# Patient Record
Sex: Female | Born: 1935 | Race: White | Hispanic: No | Marital: Married | Smoking: Never smoker
Health system: Southern US, Community
[De-identification: ages and names within clinical notes are randomized; demographics above are authoritative.]

## PROBLEM LIST (undated history)

## (undated) DIAGNOSIS — Z9842 Cataract extraction status, left eye: Secondary | ICD-10-CM

## (undated) DIAGNOSIS — G3184 Mild cognitive impairment, so stated: Secondary | ICD-10-CM

## (undated) DIAGNOSIS — G309 Alzheimer's disease, unspecified: Secondary | ICD-10-CM

## (undated) DIAGNOSIS — F0280 Dementia in other diseases classified elsewhere without behavioral disturbance: Secondary | ICD-10-CM

## (undated) DIAGNOSIS — M810 Age-related osteoporosis without current pathological fracture: Secondary | ICD-10-CM

## (undated) DIAGNOSIS — E559 Vitamin D deficiency, unspecified: Secondary | ICD-10-CM

## (undated) DIAGNOSIS — Z9841 Cataract extraction status, right eye: Secondary | ICD-10-CM

## (undated) DIAGNOSIS — E538 Deficiency of other specified B group vitamins: Secondary | ICD-10-CM

## (undated) DIAGNOSIS — E785 Hyperlipidemia, unspecified: Secondary | ICD-10-CM

## (undated) HISTORY — DX: Deficiency of other specified B group vitamins: E53.8

## (undated) HISTORY — DX: Hyperlipidemia, unspecified: E78.5

## (undated) HISTORY — DX: Age-related osteoporosis without current pathological fracture: M81.0

## (undated) HISTORY — PX: DILATION AND CURETTAGE OF UTERUS: SHX78

## (undated) HISTORY — DX: Cataract extraction status, left eye: Z98.42

## (undated) HISTORY — DX: Mild cognitive impairment of uncertain or unknown etiology: G31.84

## (undated) HISTORY — DX: Cataract extraction status, left eye: Z98.41

## (undated) HISTORY — PX: CATARACT EXTRACTION: SUR2

## (undated) HISTORY — PX: TONSILLECTOMY AND ADENOIDECTOMY: SHX28

---

## 2001-11-02 ENCOUNTER — Encounter: Payer: Self-pay | Admitting: Orthopedic Surgery

## 2001-11-02 ENCOUNTER — Ambulatory Visit (HOSPITAL_COMMUNITY): Admission: RE | Admit: 2001-11-02 | Discharge: 2001-11-02 | Payer: Self-pay | Admitting: Orthopedic Surgery

## 2003-03-23 ENCOUNTER — Encounter: Payer: Self-pay | Admitting: Emergency Medicine

## 2003-03-23 ENCOUNTER — Emergency Department (HOSPITAL_COMMUNITY): Admission: EM | Admit: 2003-03-23 | Discharge: 2003-03-23 | Payer: Self-pay | Admitting: Emergency Medicine

## 2003-05-06 ENCOUNTER — Encounter: Admission: RE | Admit: 2003-05-06 | Discharge: 2003-05-06 | Payer: Self-pay | Admitting: Family Medicine

## 2003-05-06 ENCOUNTER — Encounter: Payer: Self-pay | Admitting: Family Medicine

## 2003-05-20 ENCOUNTER — Ambulatory Visit (HOSPITAL_COMMUNITY): Admission: RE | Admit: 2003-05-20 | Discharge: 2003-05-20 | Payer: Self-pay | Admitting: Internal Medicine

## 2003-05-20 ENCOUNTER — Encounter (INDEPENDENT_AMBULATORY_CARE_PROVIDER_SITE_OTHER): Payer: Self-pay | Admitting: *Deleted

## 2005-03-02 ENCOUNTER — Encounter: Admission: RE | Admit: 2005-03-02 | Discharge: 2005-03-02 | Payer: Self-pay | Admitting: Family Medicine

## 2005-03-10 ENCOUNTER — Ambulatory Visit: Payer: Self-pay | Admitting: Pulmonary Disease

## 2005-03-14 ENCOUNTER — Ambulatory Visit: Admission: RE | Admit: 2005-03-14 | Discharge: 2005-03-14 | Payer: Self-pay | Admitting: Pulmonary Disease

## 2005-03-15 ENCOUNTER — Encounter (INDEPENDENT_AMBULATORY_CARE_PROVIDER_SITE_OTHER): Payer: Self-pay | Admitting: *Deleted

## 2005-03-15 ENCOUNTER — Ambulatory Visit: Payer: Self-pay | Admitting: Critical Care Medicine

## 2005-03-15 ENCOUNTER — Ambulatory Visit (HOSPITAL_COMMUNITY): Admission: RE | Admit: 2005-03-15 | Discharge: 2005-03-15 | Payer: Self-pay | Admitting: Critical Care Medicine

## 2005-03-22 ENCOUNTER — Ambulatory Visit: Payer: Self-pay | Admitting: Pulmonary Disease

## 2005-04-05 ENCOUNTER — Ambulatory Visit: Payer: Self-pay | Admitting: Pulmonary Disease

## 2005-06-08 ENCOUNTER — Ambulatory Visit: Payer: Self-pay | Admitting: Pulmonary Disease

## 2005-08-10 ENCOUNTER — Ambulatory Visit: Payer: Self-pay | Admitting: Pulmonary Disease

## 2005-09-19 ENCOUNTER — Ambulatory Visit: Payer: Self-pay | Admitting: Pulmonary Disease

## 2006-04-05 ENCOUNTER — Ambulatory Visit: Payer: Self-pay | Admitting: Critical Care Medicine

## 2009-02-15 ENCOUNTER — Emergency Department (HOSPITAL_COMMUNITY): Admission: EM | Admit: 2009-02-15 | Discharge: 2009-02-15 | Payer: Self-pay | Admitting: Emergency Medicine

## 2010-10-31 LAB — URINE MICROSCOPIC-ADD ON

## 2010-10-31 LAB — CBC
HCT: 43.8 % (ref 36.0–46.0)
Hemoglobin: 14.6 g/dL (ref 12.0–15.0)
MCHC: 33.4 g/dL (ref 30.0–36.0)
MCV: 95.2 fL (ref 78.0–100.0)
Platelets: 185 10*3/uL (ref 150–400)
RBC: 4.6 MIL/uL (ref 3.87–5.11)
RDW: 14.4 % (ref 11.5–15.5)
WBC: 6.5 10*3/uL (ref 4.0–10.5)

## 2010-10-31 LAB — URINALYSIS, ROUTINE W REFLEX MICROSCOPIC
Glucose, UA: 100 mg/dL — AB
Ketones, ur: 40 mg/dL — AB
Nitrite: POSITIVE — AB
Protein, ur: >300 mg/dL — AB
Specific Gravity, Urine: 1.025 (ref 1.005–1.030)
Urobilinogen, UA: 1 mg/dL (ref 0.0–1.0)
pH: 6.5 (ref 5.0–8.0)

## 2010-10-31 LAB — BASIC METABOLIC PANEL
BUN: 7 mg/dL (ref 6–23)
CO2: 28 mEq/L (ref 19–32)
Calcium: 9.1 mg/dL (ref 8.4–10.5)
Chloride: 107 mEq/L (ref 96–112)
Creatinine, Ser: 0.76 mg/dL (ref 0.4–1.2)
GFR calc Af Amer: >60 mL/min (ref >60)
GFR calc non Af Amer: >60 mL/min (ref >60)
Glucose, Bld: 97 mg/dL (ref 70–99)
Potassium: 3.5 mEq/L (ref 3.5–5.1)
Sodium: 142 mEq/L (ref 135–145)

## 2010-10-31 LAB — DIFFERENTIAL
Basophils Absolute: 0.1 10*3/uL (ref 0.0–0.1)
Basophils Relative: 1 % (ref 0–1)
Eosinophils Absolute: 0.1 10*3/uL (ref 0.0–0.7)
Eosinophils Relative: 2 % (ref 0–5)
Lymphocytes Relative: 26 % (ref 12–46)
Lymphs Abs: 1.7 10*3/uL (ref 0.7–4.0)
Monocytes Absolute: 0.7 10*3/uL (ref 0.1–1.0)
Monocytes Relative: 10 % (ref 3–12)
Neutro Abs: 3.9 10*3/uL (ref 1.7–7.7)
Neutrophils Relative %: 61 % (ref 43–77)

## 2010-10-31 LAB — URINE CULTURE: Colony Count: >=100000

## 2010-12-10 NOTE — Op Note (Signed)
NAME:  Savannah Garrison, WOODRUM NO.:  [id number]   MEDICAL RECORD NO.:  [id number]          PATIENT TYPE:  AMB   LOCATION:  ENDO                         FACILITY:  [facility]   PHYSICIAN:  Shan Levans, M.D. LHCDATE OF BIRTH:  August 13, 1929   DATE OF PROCEDURE:  03/15/2005  DATE OF DISCHARGE:                                 OPERATIVE REPORT   BRONCHOSCOPY OPERATIVE NOTE:   INDICATION:  Bilateral pulmonary infiltrates, evaluate for organized  pneumonia.   OPERATOR:  Shan Levans, M.D.   ANESTHESIA:  Local 1% Xylocaine.   PREOP MEDICATION:  1.  Demerol 30 mg.  2.  Versed 3 mg IV push.   PROCEDURE:  The Olympus video bronchoscope was introduced via the right  nares, the upper airways were visualized, were unremarkable.  The entire  tracheobronchial tree was visualized and revealed mild tracheobronchitis.  Specifically, there were no endobronchial lesions seen.  Attention was then  paid to the left lower lobe lateral segment.  This bronchoscope was wedged  into position.  A 120 mL volume was instilled.  A very poor return of  approximately 40 mL volume was obtained.  Transbronchial biopsies x6 were  obtained.  The specimens were quite small.  There were, however, no  complications and no significant bleeding seen.   COMPLICATIONS:  None at time of dictation, chest x-ray pending.   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  Evaluate for chronic unresolved pulmonary  infiltrates.   RECOMMENDATIONS:  Followup microbiology and pathology.      Shan Levans, M.D. [facility]  Electronically Signed     PW/MEDQ  D:  03/15/2005  T:  03/15/2005  Job:  [id number]   cc:   Chales Salmon. Abigail Miyamoto, M.D.  [address]  [place]  [place] [address]  Fax: [phone]   Barrington Ellison, Dr.   Patient's Chart

## 2010-12-10 NOTE — Op Note (Signed)
NAME:  Savannah Garrison, Savannah Garrison                        ACCOUNT NO.:  [id number]   MEDICAL RECORD NO.:  [id number]                   PATIENT TYPE:  AMB   LOCATION:  ENDO                                 FACILITY:  [facility]   PHYSICIAN:  Clinton D. Maple Hudson, M.D.              DATE OF BIRTH:  20-Aug-1929   DATE OF PROCEDURE:  05/20/2003  DATE OF DISCHARGE:                                 OPERATIVE REPORT   PROCEDURE PERFORMED:  Bronchoscopy.   INDICATIONS FOR PROCEDURE:  The patient is a 77 year old nonsmoking white  female with a shifting right middle lung zone infiltrate at least since  August, associated with dry cough, no response  to initial antibiotic trial,  8 pound weight loss, no  fevers or chills.   MEDICATIONS:  Preoperative evaluation  noted medications of hormones and  calcium.   ALLERGIES:  PENICILLIN WITH ITCHING.   MEDICAL HISTORY:  No history of heart or diabetic problems and no TB  exposure.   PHYSICAL EXAMINATION:  Blood pressure 125/85, weight 127 pounds. Question of  left supraclavicular fullness, uncertain if there was underlying adenopathy  or just muscle. Right mid chest crackles. Heart  sounds were regular without  murmur or gallop.   LABORATORY DATA:  Chest x-ray showed right upper lobe, right middle lobe and  right lower lobe infiltrates, shifting across serial films with no central  lesion noted.   DESCRIPTION OF PROCEDURE:  After fully informed consent a bronchoscopy was  performed on an outpatient basis in the endoscopy suite. Premedication was  not needed. The upper airway was anesthetized topically with Cetacaine and  1% Xylocaine. A cumulative dose of 5 mg of intravenous Versed was given  during the procedure for additional cough control and sedation. Oxygen was  provided at 8 liters per minute by nasal prongs, holding saturation over  92%. Cardiac  rhythm was regular with frequent premature ventricular  contractions noted, unchanged during the procedure.   After fully informed consent was obtained the bronchoscope was introduced  via the right nostril to the level  of the vocal cords without difficulty.  The cords moved normally. The trachea and main carina were unremarkable.  Secretions were clear. There was minimal erythema in the bronchus  intermedius. No foreign material or endobronchial obstruction  was noted.   Under fluoroscopic guidance the right middle lobe was brushed, lavaged and  biopsied by standard transbronchial biopsy technique. There was mild self-  limited bleeding with no significant complications pending chest x-ray.   The patient tolerated the procedure well and is being held until stable.  Then we will return her home with her family to office follow up.    IMPRESSION:  Nonspecific but atypical pneumonia  with shifting infiltrates  over a period of several months associated with question of supraclavicular  adenopathy.  Clinton D. Maple Hudson, M.D.    CDY/MEDQ  D:  05/20/2003  T:  05/20/2003  Job:  [id number]   cc:   Chales Salmon. Abigail Miyamoto, M.D.  [address]  [place]  [place] [address]  Fax: [phone]

## 2010-12-10 NOTE — Assessment & Plan Note (Signed)
[facility]                               PULMONARY OFFICE NOTE   NAME:Savannah Garrison, Savannah Garrison                     MRN:          [id number]  DATE:04/05/2006                            DOB:          04/03/1930    HISTORY OF PRESENT ILLNESS:  The patient is a 77 year old white female  patient of Dr. Craige Cotta who had a history of bronchiolitis obliterans with  organized pneumonia.  Patient presents for a 40-month followup.  Patient has  now been off of prednisone for greater than 6 months and reports that she  has had no recurrence of symptoms of dyspnea, cough, fatigue, or chest pain.  Patient reports she has been doing exceptionally well.  Patient does note,  intermittently, she has a very mild dry cough, intermittently, without any  purulent sputum, fever, or chest pain.   PAST MEDICAL HISTORY:  Reviewed.   CURRENT MEDICATIONS:  Reviewed.   PHYSICAL EXAMINATION:  GENERAL:  Patient is a pleasant female in no acute  distress.  VITAL SIGNS:  She is afebrile with stable vital signs.  Her O2 saturation is  97% on room air.  HEENT:  Unremarkable.  NECK:  Supple without adenopathy.  LUNGS:  Lung sounds are clear without any wheezes or crackles.  CARDIAC:  Regular rate and rhythm.  ABDOMEN:  Soft, benign.  EXTREMITIES:  Warm without any edema.   IMPRESSION AND PLAN:  Bronchiolitis obliterans with organized pneumonia.  Patient has now been off of prednisone for greater than 6 months without any  recurrence of symptoms.  Patient is advised if she develops any recurrence  of symptoms, she may follow back up with [place].                                   Rubye Oaks, NP                                Coralyn Helling, MD   TP/MedQ  DD:  04/06/2006  DT:  04/07/2006  Job #:  [id number]

## 2010-12-10 NOTE — Op Note (Signed)
[facility]  Patient:    Savannah Garrison, Savannah Garrison Visit Number: [id number] MRN: [id number]          Service Type: DSU Location: DAY Attending Physician:  Marlowe Kays Page Dictated by:   Illene Labrador. Aplington, M.D. Proc. Date: 11/02/01 Admit Date:  11/02/2001                             Operative Report  PREOPERATIVE DIAGNOSES: 1. Torn lateral meniscus. 2. Osteoarthritis, right knee.  POSTOPERATIVE DIAGNOSES: 1. Torn medial and lateral menisci. 2. Osteoarthritis, right knee.  OPERATION: 1. Right knee arthroscopy with one partial medial and lateral meniscectomy. 2. Shaving of medial and lateral femoral condyle.  SURGEON:  Illene Labrador. Aplington, M.D.  ASSISTANT:  Nurse.  ANESTHESIA:  General.  PATHOLOGY AND JUSTIFICATION OF PROCEDURE:  Pain and swelling in right knee with tenderness medially and an MRI showing osteoarthritis, mainly laterally with a torn lateral meniscus.  She had additional findings at surgery as discussed below.  DESCRIPTION OF PROCEDURE:  Satisfactory spinal anesthesia, pneumatic tourniquet, thigh stabilizer.  Right knee was prepped with DuraPrep and draped in a sterile field.  Superior and medial saline inflow.  First through an anteromedial portal, the lateral compartment of knee joint was evaluated.  She had extensive tearing, more than was indicated on the MRI, mainly in the middle third but also slightly anteriorly and also in the intercondylar area of the lateral meniscus.  The meniscus was trimmed back to a stable rim with a variety of baskets and then shaved down until smooth and stable on probing. The lateral femoral condyle also required some shaving because of grade 2-3/4 chondromalacia.  She also about the same extent of chondromalacia of the lateral tibial plateau which I did not shave.  Her ECL was intact.  Looking up in the lateral gutter and suprapatellar area, she had some minimal wear of the patella which I  gently debrided down, but basically this was a nonarthroscopic problem.  I then reversed portals.  She had a flap tear of the anterior third of the medial meniscus which was pictured and resected and smoothed down with the 3.5 shaver.  She also had a little fraying of the posterior curve of the medial meniscus which I shaved down as well.  The entire weightbearing surface of the medial femoral condyle had grade 2-3/4 chondromalacia, and I shaved down this as well as some rasping gently.  Final pictures were taken.  The knee joint was irrigated until clear and all fluid possible removed.  The two anterior portals were closed with 4-0 nylon.  Then 20 cc of 0.5% Marcaine with adrenalin, 4 mg of morphine were then instilled through the inflow apparatus which was removed and this portal closed with 4-0 nylon as well.  Betadine, Adaptic dry sterile dressing were applied.  Tourniquet was released.  She tolerated the procedure well and at the time of this dictation, was on her way to the recovery room in satisfactory condition with no known complications. Dictated by:   Illene Labrador. Aplington, M.D. Attending Physician:  Joaquin Courts DD:  11/02/01 TD:  11/02/01 Job: [id number]

## 2011-09-19 ENCOUNTER — Other Ambulatory Visit: Payer: Self-pay | Admitting: Diagnostic Neuroimaging

## 2011-09-19 DIAGNOSIS — G3184 Mild cognitive impairment, so stated: Secondary | ICD-10-CM

## 2011-09-23 ENCOUNTER — Ambulatory Visit
Admission: RE | Admit: 2011-09-23 | Discharge: 2011-09-23 | Disposition: A | Discharge status: Home / Self Care | Payer: Medicare HMO | Source: Ambulatory Visit | Attending: Diagnostic Neuroimaging | Admitting: Diagnostic Neuroimaging

## 2011-09-23 DIAGNOSIS — G3184 Mild cognitive impairment, so stated: Secondary | ICD-10-CM

## 2012-07-14 ENCOUNTER — Encounter (HOSPITAL_COMMUNITY): Payer: Self-pay | Admitting: Emergency Medicine

## 2012-07-14 ENCOUNTER — Emergency Department (HOSPITAL_COMMUNITY): Payer: Medicare HMO

## 2012-07-14 ENCOUNTER — Emergency Department (HOSPITAL_COMMUNITY)
Admission: EM | Admit: 2012-07-14 | Discharge: 2012-07-14 | Disposition: A | Discharge status: Home / Self Care | Payer: Medicare HMO | Attending: Emergency Medicine | Admitting: Emergency Medicine

## 2012-07-14 DIAGNOSIS — Y9301 Activity, walking, marching and hiking: Secondary | ICD-10-CM | POA: Insufficient documentation

## 2012-07-14 DIAGNOSIS — S0003XA Contusion of scalp, initial encounter: Secondary | ICD-10-CM | POA: Insufficient documentation

## 2012-07-14 DIAGNOSIS — Z7982 Long term (current) use of aspirin: Secondary | ICD-10-CM | POA: Insufficient documentation

## 2012-07-14 DIAGNOSIS — Y9289 Other specified places as the place of occurrence of the external cause: Secondary | ICD-10-CM | POA: Insufficient documentation

## 2012-07-14 DIAGNOSIS — W1809XA Striking against other object with subsequent fall, initial encounter: Secondary | ICD-10-CM | POA: Insufficient documentation

## 2012-07-14 DIAGNOSIS — S0083XA Contusion of other part of head, initial encounter: Secondary | ICD-10-CM

## 2012-07-14 NOTE — ED Notes (Signed)
Pt reports she fell doing yard work onto cement and hit her head. Pt has moderately sized hematoma above her right eye. Pt denies double, blurred, or any changes to vision. Pt denies LOC.

## 2012-07-14 NOTE — ED Provider Notes (Signed)
History     CSN: [id number]  Arrival date & time 07/14/12  1433   First MD Initiated Contact with Patient 07/14/12 1518      Chief Complaint  Patient presents with  . Fall    Hematoma aboce right eye    (Consider location/radiation/quality/duration/timing/severity/associated sxs/prior treatment) HPI Comments: Patient was cleaning up the yard and was breaking sticks when she fell and hit her head on the curb.  She was not knocked out but does have a large hematoma to the right forehead and eyebrow area.  She denies severe headache, neck pain, visual complaints.  Takes one 81 mg asa daily.  Patient is a 78 y.o. female presenting with fall. The history is provided by the patient.  Fall The accident occurred less than 1 hour ago. The fall occurred while walking. She fell from a height of 1 to 2 ft. She landed on concrete. The volume of blood lost was minimal. The point of impact was the head. The pain is present in the head. The pain is moderate. She was ambulatory at the scene. There was no entrapment after the fall. Pertinent negatives include no visual change.    History reviewed. No pertinent past medical history.  History reviewed. No pertinent past surgical history.  No family history on file.  History  Substance Use Topics  . Smoking status: Not on file  . Smokeless tobacco: Never Used  . Alcohol Use: Yes     Comment: Burbon every night.     OB History    Grav Para Term Preterm Abortions TAB SAB Ect Mult Living                  Review of Systems  All other systems reviewed and are negative.    Allergies  Review of patient's allergies indicates not on file.  Home Medications  No current outpatient prescriptions on file.  BP 161/74  Pulse 74  Temp 98 F (36.7 C) (Oral)  Resp 18  SpO2 100%  Physical Exam  Nursing note and vitals reviewed. Constitutional: She is oriented to person, place, and time. She appears well-developed and well-nourished. No  distress.  HENT:  Head: Normocephalic.       The right eyebrow and forehead have a large hematoma with a small abrasion that does not require stitches.  Neck: Normal range of motion. Neck supple.       No cervical spine ttp or stepoffs.  Full rom without pain.  Cardiovascular: Normal rate and regular rhythm.   Pulmonary/Chest: Effort normal and breath sounds normal.  Abdominal: Soft. Bowel sounds are normal.  Musculoskeletal: Normal range of motion. She exhibits no edema.  Neurological: She is alert and oriented to person, place, and time. No cranial nerve deficit. She exhibits normal muscle tone. Coordination normal.  Skin: Skin is warm and dry. She is not diaphoretic.    ED Course  Procedures (including critical care time)  Labs Reviewed - No data to display No results found.   No diagnosis found.    MDM  The ct shows no intracranial injury but she does have a large hematoma.  The abrasion was cleaned and bacitracin and bandaid applied.  She will be discharged to home.  To return prn.        Geoffery Lyons, MD 07/14/12 [phone]

## 2012-10-11 ENCOUNTER — Other Ambulatory Visit: Payer: Self-pay | Admitting: Diagnostic Neuroimaging

## 2012-12-25 ENCOUNTER — Encounter: Payer: Self-pay | Admitting: Diagnostic Neuroimaging

## 2012-12-25 ENCOUNTER — Ambulatory Visit (INDEPENDENT_AMBULATORY_CARE_PROVIDER_SITE_OTHER): Payer: Medicare HMO | Admitting: Diagnostic Neuroimaging

## 2012-12-25 VITALS — BP 185/99 | HR 75 | Temp 97.0°F | Ht 63.5 in | Wt 139.0 lb

## 2012-12-25 DIAGNOSIS — R413 Other amnesia: Secondary | ICD-10-CM

## 2012-12-25 MED ORDER — DONEPEZIL HCL 10 MG PO TABS: 10.0000 mg | ORAL_TABLET | Freq: Every day | ORAL | Status: DC

## 2012-12-25 NOTE — Patient Instructions (Addendum)
Increase donepezil 10 mg daily.   Consider Namenda XRT 20 mg daily depending on co-pay.

## 2012-12-25 NOTE — Progress Notes (Signed)
[facility]  PATIENT: Savannah Garrison DOB: 09/06/1929  REFERRING CLINICIAN:  HISTORY FROM: patient, husband, daughter, son-in-law REASON FOR VISIT: follow up   HISTORICAL  CHIEF COMPLAINT:  Chief Complaint  Patient presents with  . Follow-up    memory    HISTORY OF PRESENT ILLNESS:   UPDATE 12/25/12: since last visit, patient has had progression of memory loss. This is noted by patient and her family. She is misplacing more objects in the home and kitchen. She is repeating herself more often. 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 patient is tolerating donepezil.  UPDATE 09/09/11: Memory loss has slightly progressed. Forgeting names of people. Repeats herself more. More problems in unfamiliar situations. Still drives without problems, although no one has been in the car with her when she is driving. No probs with cooking.  PRIOR HPI (02/18/10): 79 year old right-handed female with no significant past medical history presenting for evaluation of memory difficulty over the past few months. He is accompanied by her husband at this visit.  Patient reports some difficulty in conversation where in mid sentence she forgets what word to say next. This has been intermittent over the past few months. She thinks it's been less than 6 months. She is concerned because her father and paternal uncle both were diagnosed with Alzheimer's disease in their 18s. Patient has noticed the symptoms herself, but her husband and her daughter have noticed it more. Patient is not that bothered by her symptoms. She continues to drive, is able to shop, maintains household finances and also does cooking and other house chores.  REVIEW OF SYSTEMS: Full 14 system review of systems performed and notable only for ringing in ears bruising runny nose decreased energy dizziness memory loss  confusion.  ALLERGIES: No Known Allergies  HOME MEDICATIONS: Outpatient Prescriptions Prior to Visit  Medication Sig Dispense Refill  . aspirin EC 81 MG tablet Take 81 mg by mouth daily.      Marland Kitchen donepezil (ARICEPT) 5 MG tablet TAKE 1 TABLET BY MOUTH EVERY NIGHT  30 tablet  3  . Multiple Vitamin (MULTIVITAMIN WITH MINERALS) TABS Take 1 tablet by mouth daily.       No facility-administered medications prior to visit.    PAST MEDICAL HISTORY: Past Medical History  Diagnosis Date  . MCI (mild cognitive impairment)     PAST SURGICAL HISTORY: Past Surgical History  Procedure Laterality Date  . Dilation and curettage of uterus      FAMILY HISTORY: Family History  Problem Relation Age of Onset  . Lung cancer Mother   . Alzheimer's disease Father     SOCIAL HISTORY:  History   Social History  . Marital Status: Married    Spouse Name: Aldrad    Number of Children: 2  . Years of Education: College   Occupational History  . Retired    Social History Main Topics  . Smoking status: Never Smoker   . Smokeless tobacco: Never Used  . Alcohol Use: Yes     Comment: Burbon every night.   . Drug Use: No  . Sexually Active: Not on file   Other Topics Concern  . Not on file   Social History Narrative   Pt lives at home with spouse.    Has been married for 53+ years.   Caffeine Use: 1-2 glasses daily (green tea)     PHYSICAL  EXAM  Filed Vitals:   12/25/12 1545  BP: 201/102  Pulse: 74  Temp: 97 F (36.1 C)  TempSrc: Oral  Height: 5' 3.5" (1.613 m)  Weight: 139 lb (63.05 kg)    Not recorded    Body mass index is 24.23 kg/(m^2).  GENERAL EXAM: Patient is in no distress  CARDIOVASCULAR: Regular rate and rhythm, no murmurs, no carotid bruits  NEUROLOGIC: MENTAL STATUS: awake, alert, language fluent, comprehension intact, naming intact; BORDERLINE PALMOMENTAL. MMSE 22/30 (MISSES YEAR, DAY, DATE, COUNTY; 0/3 RECALL, 2/3 DIRECTIONS). AFT 6. CRANIAL NERVE:  pupils equal and reactive to light, visual fields full to confrontation, extraocular muscles intact, no nystagmus, facial sensation and strength symmetric, uvula midline, shoulder shrug symmetric, tongue midline. MOTOR: normal bulk and tone, full strength in the BUE, BLE SENSORY: normal and symmetric to light touch, pinprick, temperature, vibration COORDINATION: finger-nose-finger, fine finger movements normal REFLEXES: deep tendon reflexes present and symmetric GAIT/STATION: SLOW ANTALGIC GAIT. RIGHT KNEE DEVIATED MEDIALLY. DECR RIGHT ARM SWING. STOOP POSTURE. SLOW MOVING.   DIAGNOSTIC DATA (LABS, IMAGING, TESTING) - I reviewed patient records, labs, notes, testing and imaging myself where available.  Lab Results  Component Value Date   WBC 6.5 02/15/2009   HGB 14.6 02/15/2009   HCT 43.8 02/15/2009   MCV 95.2 02/15/2009   PLT 185 02/15/2009      Component Value Date/Time   NA 142 02/15/2009 1740   K 3.5 02/15/2009 1740   CL 107 02/15/2009 1740   CO2 28 02/15/2009 1740   GLUCOSE 97 02/15/2009 1740   BUN 7 02/15/2009 1740   CREATININE 0.76 02/15/2009 1740   CALCIUM 9.1 02/15/2009 1740   GFRNONAA >60 02/15/2009 1740   GFRAA  Value: >60        The eGFR has been calculated using the MDRD equation. This calculation has not been validated in all clinical situations. eGFR's persistently <60 mL/min signify possible Chronic Kidney Disease. 02/15/2009 1740   No results found for this basename: CHOL, HDL, LDLCALC, LDLDIRECT, TRIG, CHOLHDL   No results found for this basename: HGBA1C   No results found for this basename: VITAMINB12   No results found for this basename: TSH    10/07/10  B12 - 138 (L) TSH 0.64 RPR NR  09/23/11 MRI brain - mild chronic microvascular ischemia and supratentorial cortical atrophy   ASSESSMENT AND PLAN  79 y.o. year old female  has a past medical history of MCI (mild cognitive impairment). here with progressive short-term memory loss. Prior B12 level in 2012 was very  low, was on oral replacement in the past, however patient is not on replacement currently.    Ddx: MCI, mild dementia, Vit B12 deficiency   PLAN: 1. F/u with PCP re: B12 deficiency and hypertension (201/102 --> 185/99 today, but asymptomatic) 2. I will increase donepezil 10 mg daily. May consider adding Namenda or enrollment into research trial.   Suanne Marker, MD 12/25/2012, 4:41 PM Certified in Neurology, Neurophysiology and Neuroimaging  [facility] [address] [place], [place] [address] [phone]

## 2012-12-26 ENCOUNTER — Other Ambulatory Visit: Payer: Self-pay | Admitting: Diagnostic Neuroimaging

## 2012-12-26 NOTE — Telephone Encounter (Signed)
Dr Marjory Lies said they would consider Namenda depending on patients co-pay.  Patient was going to call insurance and discuss coverage/co-pay amount before deciding wether or not to start meds.  I called the patient.  She would prefer that we speak to her husband.  Will call back in the morning.

## 2012-12-27 MED ORDER — MEMANTINE HCL ER 7 & 14 & 21 &28 MG PO CP24: 7.0000 mg | ORAL_CAPSULE | ORAL | Status: DC

## 2012-12-27 NOTE — Telephone Encounter (Signed)
I called back.  Spoke with spouse.  He said they did call ins and they are okay with the Co-pay for Namenda XR.  They would like to start on the Titration pack.  Spouse is aware the pharmacy may not have the starter kit in stock, and may have to order it.  (We do not currently have samples, as they are not available at this time).

## 2013-01-19 ENCOUNTER — Other Ambulatory Visit: Payer: Self-pay | Admitting: Diagnostic Neuroimaging

## 2013-05-03 ENCOUNTER — Ambulatory Visit (INDEPENDENT_AMBULATORY_CARE_PROVIDER_SITE_OTHER): Payer: Medicare HMO | Admitting: Nurse Practitioner

## 2013-05-03 ENCOUNTER — Encounter: Payer: Self-pay | Admitting: Diagnostic Neuroimaging

## 2013-05-03 ENCOUNTER — Encounter (INDEPENDENT_AMBULATORY_CARE_PROVIDER_SITE_OTHER): Payer: Self-pay

## 2013-05-03 VITALS — BP 143/83 | HR 73 | Temp 98.0°F | Ht 63.5 in | Wt 132.0 lb

## 2013-05-03 DIAGNOSIS — F03A0 Unspecified dementia, mild, without behavioral disturbance, psychotic disturbance, mood disturbance, and anxiety: Secondary | ICD-10-CM

## 2013-05-03 DIAGNOSIS — F0390 Unspecified dementia without behavioral disturbance: Secondary | ICD-10-CM

## 2013-05-03 DIAGNOSIS — R413 Other amnesia: Secondary | ICD-10-CM

## 2013-05-03 MED ORDER — DONEPEZIL HCL 10 MG PO TABS: 10.0000 mg | ORAL_TABLET | Freq: Every day | ORAL | Status: DC

## 2013-05-03 NOTE — Patient Instructions (Signed)
Refills for Donepizil (Aricept) 10 mg sent to [facility] on [address].  It is a 90 day supply, take 1 daily at bedtime.  Follow up in 6 months.  http://www.senior-[facility]-[place].org  Search "[facility]."  [employer] informational resource:      http://www.aboutassistedliving.org   [facility]   Www.homeinstead.com  [facility]  [facility]  WirelessSleep.no  Having a home evaluation Having a care giver come to the home and conduct a thorough assessment of how you or your loved one functions in the home is the best way to determine if assisted living is appropriate. This type of assessment is referred to as a home evaluation, or a home assessment. This assessment doesn't take much time and will be done through [facility].

## 2013-05-03 NOTE — Progress Notes (Signed)
[facility]  PATIENT: Savannah Garrison DOB: 11/20/1929   REASON FOR VISIT: follow up HISTORY FROM: daughter, husband, patient  HISTORY OF PRESENT ILLNESS: UPDATE 05/03/13 (LL): since last visit, no significant change.  Patient did not tolerate Namenda due to stomach upset, confusion.  They have not followed up on B12 as requested. BP is much better in office today,  143/83.  Patient has no complaints.  UPDATE 12/25/12: since last visit, patient has had progression of memory loss. This is noted by patient and her family. She is misplacing more objects in the home and kitchen. She is repeating herself more often. 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 patient is tolerating donepezil.  UPDATE 09/09/11: Memory loss has slightly progressed. Forgeting names of people. Repeats herself more. More problems in unfamiliar situations. Still drives without problems, although no one has been in the car with her when she is driving. No probs with cooking.  PRIOR HPI (02/18/10): 78 year old right-handed female with no significant past medical history presenting for evaluation of memory difficulty over the past few months. He is accompanied by her husband at this visit.  Patient reports some difficulty in conversation where in mid sentence she forgets what word to say next. This has been intermittent over the past few months. She thinks it's been less than 6 months. She is concerned because her father and paternal uncle both were diagnosed with Alzheimer's disease in their 8s. Patient has noticed the symptoms herself, but her husband and her daughter have noticed it more. Patient is not that bothered by her symptoms. She continues to drive, is able to shop, maintains household finances and also does cooking and other house chores.  REVIEW OF SYSTEMS: Full 14 system review of systems performed  and notable only for ringing in ears bruising runny nose decreased energy dizziness memory loss confusion.   ALLERGIES: No Known Allergies  HOME MEDICATIONS: Outpatient Prescriptions Prior to Visit  Medication Sig Dispense Refill  . aspirin EC 81 MG tablet Take 81 mg by mouth daily.      . Multiple Vitamin (MULTIVITAMIN WITH MINERALS) TABS Take 1 tablet by mouth daily.      . Memantine HCl ER (NAMENDA XR TITRATION PACK) 7 & 14 & 21 &28 MG CP24 Take 7-28 mg by mouth as directed. Follow package instructions for titration dose  28 capsule  0  . donepezil (ARICEPT) 10 MG tablet Take 1 tablet (10 mg total) by mouth at bedtime.  30 tablet  12   No facility-administered medications prior to visit.    PAST MEDICAL HISTORY: Past Medical History  Diagnosis Date  . MCI (mild cognitive impairment)     PAST SURGICAL HISTORY: Past Surgical History  Procedure Laterality Date  . Dilation and curettage of uterus      FAMILY HISTORY: Family History  Problem Relation Age of Onset  . Lung cancer Mother   . Alzheimer's disease Father     SOCIAL HISTORY: History   Social History  . Marital Status: Married    Spouse Name: Aldrad    Number of Children: 2  . Years of Education: College   Occupational History  . Retired    Social History Main Topics  . Smoking status: Never Smoker   . Smokeless tobacco: Never Used  . Alcohol Use: Yes     Comment: Burbon every night.   Marland Kitchen  Drug Use: No  . Sexual Activity: Not on file   Other Topics Concern  . Not on file   Social History Narrative   Pt lives at home with spouse.    Has been married for 53+ years.   Caffeine Use: 1-2 glasses daily (green tea)     PHYSICAL EXAM  Filed Vitals:   05/03/13 0846  BP: 143/83  Pulse: 73  Temp: 98 F (36.7 C)  TempSrc: Oral  Height: 5' 3.5" (1.613 m)  Weight: 132 lb (59.875 kg)   Body mass index is 23.01 kg/(m^2).  GENERAL EXAM:  Patient is in no distress  CARDIOVASCULAR:  Regular rate  and rhythm, no murmurs, no carotid bruits  NEUROLOGIC:  MENTAL STATUS: awake, alert, language fluent, comprehension intact, naming intact; BORDERLINE PALMOMENTAL. MMSE 23/30 (MISSES YEAR, DAY, DATE, STATE, COUNTY; 0/3 RECALL, 3/3 DIRECTIONS). AFT 7. CLOCK DRAWING 4/4, GCS 2, not depressed). (Last Visit 12/25/12: MMSE 22/30 (MISSES YEAR, DAY, DATE, COUNTY; 0/3 RECALL, 2/3 DIRECTIONS). AFT 6. ) CRANIAL NERVE: pupils equal and reactive to light, visual fields full to confrontation, extraocular muscles intact, no nystagmus, facial sensation and strength symmetric, uvula midline, shoulder shrug symmetric, tongue midline.  MOTOR: normal bulk and tone, full strength in the BUE, BLE  SENSORY: normal and symmetric to light touch, pinprick, temperature, vibration  COORDINATION: finger-nose-finger, fine finger movements normal  REFLEXES: deep tendon reflexes present and symmetric  GAIT/STATION: SLOW ANTALGIC GAIT. RIGHT KNEE DEVIATED MEDIALLY. DECR RIGHT ARM SWING. STOOP POSTURE. SLOW MOVING.  DIAGNOSTIC DATA (LABS, IMAGING, TESTING) - I reviewed patient records, labs, notes, testing and imaging myself where available.  Lab Results  Component Value Date   WBC 6.5 02/15/2009   HGB 14.6 02/15/2009   HCT 43.8 02/15/2009   MCV 95.2 02/15/2009   PLT 185 02/15/2009      Component Value Date/Time   NA 142 02/15/2009 1740   K 3.5 02/15/2009 1740   CL 107 02/15/2009 1740   CO2 28 02/15/2009 1740   GLUCOSE 97 02/15/2009 1740   BUN 7 02/15/2009 1740   CREATININE 0.76 02/15/2009 1740   CALCIUM 9.1 02/15/2009 1740   GFRNONAA >60 02/15/2009 1740   GFRAA  Value: >60        The eGFR has been calculated using the MDRD equation. This calculation has not been validated in all clinical situations. eGFR's persistently <60 mL/min signify possible Chronic Kidney Disease. 02/15/2009 1740   10/07/10    B12 - 138 (L)   TSH 0.64  RPR NR   09/23/11 MRI brain - mild chronic microvascular ischemia and supratentorial cortical atrophy    ASSESSMENT AND PLAN 79 y.o. year old female  has a past medical history of MCI (mild cognitive impairment) here with Mild Dementia.  Prior B12 level in 2012 was very low, was on oral replacement in the past, however patient is not on replacement currently.    PLAN: 1. Requested again that family F/u with PCP re: B12 deficiency  2. Continue donepizil 10 mg daily. Refills sent. 3. Information was given regarding resources in [employer] for seniors and caregivers of dementia patients.  4. Follow up in 6 months with Dr. Marjory Lies.  Meds ordered this encounter  Medications  . donepezil (ARICEPT) 10 MG tablet    Sig: Take 1 tablet (10 mg total) by mouth at bedtime.    Dispense:  90 tablet    Refill:  3    Order Specific Question:  Supervising Provider    Answer:  Joycelyn Schmid R [[id number]]    Ronal Fear, MSN, NP-C 05/03/2013, 9:37 AM [facility] [address], [address] [place], [place] [address] [phone]

## 2013-05-13 NOTE — Progress Notes (Signed)
I reviewed note and agree with plan.   Suanne Marker, MD 05/13/2013, 11:21 PM Certified in Neurology, Neurophysiology and Neuroimaging  [facility] [address] [place], [place] [address] [phone]

## 2013-05-30 ENCOUNTER — Other Ambulatory Visit: Payer: Self-pay

## 2013-11-05 ENCOUNTER — Ambulatory Visit (INDEPENDENT_AMBULATORY_CARE_PROVIDER_SITE_OTHER): Payer: Commercial Managed Care - HMO | Admitting: Diagnostic Neuroimaging

## 2013-11-05 ENCOUNTER — Encounter: Payer: Self-pay | Admitting: Diagnostic Neuroimaging

## 2013-11-05 VITALS — BP 128/81 | HR 65 | Ht 61.5 in | Wt 132.0 lb

## 2013-11-05 DIAGNOSIS — F03B18 Unspecified dementia, moderate, with other behavioral disturbance: Secondary | ICD-10-CM | POA: Insufficient documentation

## 2013-11-05 DIAGNOSIS — F03A0 Unspecified dementia, mild, without behavioral disturbance, psychotic disturbance, mood disturbance, and anxiety: Secondary | ICD-10-CM

## 2013-11-05 DIAGNOSIS — F0390 Unspecified dementia without behavioral disturbance: Secondary | ICD-10-CM

## 2013-11-05 DIAGNOSIS — R413 Other amnesia: Secondary | ICD-10-CM | POA: Insufficient documentation

## 2013-11-05 DIAGNOSIS — F0391 Unspecified dementia with behavioral disturbance: Secondary | ICD-10-CM | POA: Insufficient documentation

## 2013-11-05 NOTE — Patient Instructions (Signed)
Continue donepezil.   Caution with driving, cooking, taking meds. Ask family to supervise these activities.

## 2013-11-05 NOTE — Progress Notes (Signed)
[facility]  PATIENT: Savannah Garrison DOB: Mar 05, 1930  REFERRING CLINICIAN:  HISTORY FROM: patient, husband, daughter REASON FOR VISIT: follow up    HISTORICAL  CHIEF COMPLAINT:  Chief Complaint  Patient presents with  . Follow-up    memory    HISTORY OF PRESENT ILLNESS:   UPDATE 11/05/13 (VRP): Doing well. No new issues. Still cooks, cleans, cleans yard, drives a little. Tolerating donepezil.  UPDATE 05/03/13 (LL): since last visit, no significant change. Patient did not tolerate Namenda due to stomach upset, confusion. They have not followed up on B12 as requested. BP is much better in office today, 143/83. Patient has no complaints.  UPDATE 12/25/12: since last visit, patient has had progression of memory loss. This is noted by patient and her family. She is misplacing more objects in the home and kitchen. She is repeating herself more often. 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 patient is tolerating donepezil.   UPDATE 09/09/11: Memory loss has slightly progressed. Forgeting names of people. Repeats herself more. More problems in unfamiliar situations. Still drives without problems, although no one has been in the car with her when she is driving. No probs with cooking.   PRIOR HPI (02/18/10): 80 year old right-handed female with no significant past medical history presenting for evaluation of memory difficulty over the past few months. He is accompanied by her husband at this visit.   Patient reports some difficulty in conversation where in mid sentence she forgets what word to say next. This has been intermittent over the past few months. She thinks it's been less than 6 months. She is concerned because her father and paternal uncle both were diagnosed with Alzheimer's disease in their 52s. Patient has noticed the symptoms herself, but her husband and her  daughter have noticed it more. Patient is not that bothered by her symptoms. She continues to drive, is able to shop, maintains household finances and also does cooking and other house chores.   REVIEW OF SYSTEMS: Full 14 system review of systems performed and notable only for runny nose, env allergies, confusion, back pain, knee pain.  ALLERGIES: No Known Allergies  HOME MEDICATIONS: Outpatient Prescriptions Prior to Visit  Medication Sig Dispense Refill  . aspirin EC 81 MG tablet Take 81 mg by mouth daily.      Marland Kitchen donepezil (ARICEPT) 10 MG tablet Take 1 tablet (10 mg total) by mouth at bedtime.  90 tablet  3  . Memantine HCl ER (NAMENDA XR TITRATION PACK) 7 & 14 & 21 &28 MG CP24 Take 7-28 mg by mouth as directed. Follow package instructions for titration dose  28 capsule  0  . Multiple Vitamin (MULTIVITAMIN WITH MINERALS) TABS Take 1 tablet by mouth daily.       No facility-administered medications prior to visit.    PAST MEDICAL HISTORY: Past Medical History  Diagnosis Date  . MCI (mild cognitive impairment)     PAST SURGICAL HISTORY: Past Surgical History  Procedure Laterality Date  . Dilation and curettage of uterus      FAMILY HISTORY: Family History  Problem Relation Age of Onset  . Lung cancer Mother   . Alzheimer's disease Father     SOCIAL HISTORY:  History   Social History  . Marital Status: Married    Spouse Name: Aldrad    Number of Children: 2  . Years of Education: [employer]  Occupational History  . Retired    Social History Main Topics  . Smoking status: Never Smoker   . Smokeless tobacco: Never Used  . Alcohol Use: Yes     Comment: Burbon every night.   . Drug Use: No  . Sexual Activity: Not on file   Other Topics Concern  . Not on file   Social History Narrative   Pt lives at home with spouse.    Has been married for 53+ years.   Caffeine Use: 1-2 glasses daily (green tea)     PHYSICAL EXAM  Filed Vitals:   11/05/13 1142  BP:  128/81  Pulse: 65  Height: 5' 1.5" (1.562 m)  Weight: 132 lb (59.875 kg)    Not recorded    Body mass index is 24.54 kg/(m^2).  GENERAL EXAM:  Patient is in no distress   CARDIOVASCULAR:  Regular rate and rhythm, no murmurs, no carotid bruits   NEUROLOGIC:  MENTAL STATUS: awake, alert, language fluent, comprehension intact, naming intact; BORDERLINE MYERSONS AND PALMOMENTAL. MMSE 22/30 (MISSES DATE, SERIAL 7S X 4, 0/3 RECALL). AFT 4.  CRANIAL NERVE: pupils equal and reactive to light, visual fields full to confrontation, extraocular muscles intact, no nystagmus, facial sensation and strength symmetric, uvula midline, shoulder shrug symmetric, tongue midline.  MOTOR: normal bulk and tone, full strength in the BUE, BLE  SENSORY: normal and symmetric to light touch, temperature, vibration  COORDINATION: finger-nose-finger, fine finger movements normal  REFLEXES: deep tendon reflexes present and symmetric  GAIT/STATION: SLOW ANTALGIC GAIT. RIGHT KNEE DEVIATED MEDIALLY. DECR RIGHT ARM SWING. STOOP POSTURE. SLOW MOVING.    DIAGNOSTIC DATA (LABS, IMAGING, TESTING) - I reviewed patient records, labs, notes, testing and imaging myself where available.  Lab Results  Component Value Date   WBC 6.5 02/15/2009   HGB 14.6 02/15/2009   HCT 43.8 02/15/2009   MCV 95.2 02/15/2009   PLT 185 02/15/2009      Component Value Date/Time   NA 142 02/15/2009 1740   K 3.5 02/15/2009 1740   CL 107 02/15/2009 1740   CO2 28 02/15/2009 1740   GLUCOSE 97 02/15/2009 1740   BUN 7 02/15/2009 1740   CREATININE 0.76 02/15/2009 1740   CALCIUM 9.1 02/15/2009 1740   GFRNONAA >60 02/15/2009 1740   GFRAA  Value: >60        The eGFR has been calculated using the MDRD equation. This calculation has not been validated in all clinical situations. eGFR's persistently <60 mL/min signify possible Chronic Kidney Disease. 02/15/2009 1740   No results found for this basename: CHOL, HDL, LDLCALC, LDLDIRECT, TRIG, CHOLHDL   No  results found for this basename: HGBA1C   No results found for this basename: VITAMINB12   No results found for this basename: TSH     10/07/10  B12 - 138 (L)  TSH 0.64  RPR NR   09/23/11 MRI brain - mild chronic microvascular ischemia and supratentorial cortical atrophy    ASSESSMENT AND PLAN  80 y.o. year old female here with MCI vs mild dementia. Here with progressive short-term memory loss, but now stable MMSE over past 1-2 years. Prior B12 level in 2012 was low (132), was on oral replacement in the past, however patient is not on replacement currently.   MMSE 02/18/10 - 25 09/09/11 - 22 12/25/12 - 22 05/03/13 - 23 11/05/13 - 22  Ddx: MCI vs mild dementia vs B12 deficiency   PLAN:  1. Continue donepezil 10 mg daily 2. Follow up with  PCP re: low B12   Return in about 1 year (around 11/06/2014).    Penni Bombard, MD 4/53/6468, 03:21 PM Certified in Neurology, Neurophysiology and [facility] [address] [place], [place] [address] [phone]

## 2014-06-09 ENCOUNTER — Other Ambulatory Visit: Payer: Self-pay

## 2014-06-09 MED ORDER — DONEPEZIL HCL 10 MG PO TABS: 10.0000 mg | ORAL_TABLET | Freq: Every day | ORAL | Status: DC

## 2014-08-11 DIAGNOSIS — M533 Sacrococcygeal disorders, not elsewhere classified: Secondary | ICD-10-CM | POA: Diagnosis not present

## 2014-10-30 DIAGNOSIS — M533 Sacrococcygeal disorders, not elsewhere classified: Secondary | ICD-10-CM | POA: Diagnosis not present

## 2014-10-30 DIAGNOSIS — Z23 Encounter for immunization: Secondary | ICD-10-CM | POA: Diagnosis not present

## 2014-11-01 DIAGNOSIS — H1030 Unspecified acute conjunctivitis, unspecified eye: Secondary | ICD-10-CM | POA: Diagnosis not present

## 2014-11-01 DIAGNOSIS — I10 Essential (primary) hypertension: Secondary | ICD-10-CM | POA: Diagnosis not present

## 2014-11-01 DIAGNOSIS — T7840XA Allergy, unspecified, initial encounter: Secondary | ICD-10-CM | POA: Diagnosis not present

## 2014-11-06 DIAGNOSIS — M533 Sacrococcygeal disorders, not elsewhere classified: Secondary | ICD-10-CM | POA: Diagnosis not present

## 2014-11-07 ENCOUNTER — Ambulatory Visit: Payer: Commercial Managed Care - HMO | Admitting: Diagnostic Neuroimaging

## 2014-11-11 ENCOUNTER — Encounter: Payer: Self-pay | Admitting: Diagnostic Neuroimaging

## 2014-11-11 ENCOUNTER — Ambulatory Visit (INDEPENDENT_AMBULATORY_CARE_PROVIDER_SITE_OTHER): Payer: Commercial Managed Care - HMO | Admitting: Diagnostic Neuroimaging

## 2014-11-11 VITALS — BP 162/96 | HR 80 | Ht 61.0 in | Wt 133.0 lb

## 2014-11-11 DIAGNOSIS — F0390 Unspecified dementia without behavioral disturbance: Secondary | ICD-10-CM

## 2014-11-11 DIAGNOSIS — F03B0 Unspecified dementia, moderate, without behavioral disturbance, psychotic disturbance, mood disturbance, and anxiety: Secondary | ICD-10-CM

## 2014-11-11 NOTE — Progress Notes (Signed)
[facility]  PATIENT: Savannah Garrison DOB: 1930/06/02  REFERRING CLINICIAN:  HISTORY FROM: patient, husband, daughter REASON FOR VISIT: follow up    HISTORICAL  CHIEF COMPLAINT:  Chief Complaint  Patient presents with  . Memory Loss    RM 6 - Follow up - Her family is with her - MMSE- 18/30 - Aft 2  . Dementia    HISTORY OF PRESENT ILLNESS:   UPDATE 11/05/13 (VRP): Doing well. No new issues. Still cooks, cleans, cleans yard, drives a little. Tolerating donepezil.  UPDATE 05/03/13 (LL): since last visit, no significant change. Patient did not tolerate Namenda due to stomach upset, confusion. They have not followed up on B12 as requested. BP is much better in office today, 143/83. Patient has no complaints.  UPDATE 12/25/12: since last visit, patient has had progression of memory loss. This is noted by patient and her family. She is misplacing more objects in the home and kitchen. She is repeating herself more often. 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 patient is tolerating donepezil.   UPDATE 09/09/11: Memory loss has slightly progressed. Forgeting names of people. Repeats herself more. More problems in unfamiliar situations. Still drives without problems, although no one has been in the car with her when she is driving. No probs with cooking.   PRIOR HPI (02/18/10): 81 year old right-handed female with no significant past medical history presenting for evaluation of memory difficulty over the past few months. He is accompanied by her husband at this visit.   Patient reports some difficulty in conversation where in mid sentence she forgets what word to say next. This has been intermittent over the past few months. She thinks it's been less than 6 months. She is concerned because her father and paternal uncle both were diagnosed with Alzheimer's disease in  their 35s. Patient has noticed the symptoms herself, but her husband and her daughter have noticed it more. Patient is not that bothered by her symptoms. She continues to drive, is able to shop, maintains household finances and also does cooking and other house chores.   REVIEW OF SYSTEMS: Full 14 system review of systems performed and notable only for runny nose, env allergies, confusion, back pain, knee pain.  ALLERGIES: No Known Allergies  HOME MEDICATIONS: Outpatient Prescriptions Prior to Visit  Medication Sig Dispense Refill  . aspirin EC 81 MG tablet Take 81 mg by mouth daily.    . Vitamin D, Ergocalciferol, (DRISDOL) 50000 UNITS CAPS capsule Take 50,000 Units by mouth every 7 (seven) days.    Marland Kitchen donepezil (ARICEPT) 10 MG tablet Take 1 tablet (10 mg total) by mouth at bedtime. (Patient not taking: Reported on 11/11/2014) 90 tablet 1   No facility-administered medications prior to visit.    PAST MEDICAL HISTORY: Past Medical History  Diagnosis Date  . MCI (mild cognitive impairment)     PAST SURGICAL HISTORY: Past Surgical History  Procedure Laterality Date  . Dilation and curettage of uterus    . Tonsillectomy and adenoidectomy      FAMILY HISTORY: Family History  Problem Relation Age of Onset  . Lung cancer Mother   . Alzheimer's disease Father     SOCIAL HISTORY:  History   Social History  . Marital Status: Married    Spouse Name: Aldrad  . Number of Children: 2  . Years of Education: College   Occupational History  .  Retired    Social History Main Topics  . Smoking status: Never Smoker   . Smokeless tobacco: Never Used  . Alcohol Use: 0.0 oz/week    0 Standard drinks or equivalent per week     Comment: Burbon every night.   . Drug Use: No  . Sexual Activity: Not on file   Other Topics Concern  . Not on file   Social History Narrative   Pt lives at home with spouse. Warrick Parisian)   Has been married for 53+ years.   Education [employer].   Right  handed.   Caffeine Use: 1-2 glasses daily (green tea)     PHYSICAL EXAM  Filed Vitals:   11/11/14 1138  BP: 162/96  Pulse: 80  Height: _0  (1.549 m)  Weight: 133 lb (60.328 kg)    Not recorded      Body mass index is 25.14 kg/(m^2).  MMSE - Mini Mental State Exam 11/11/2014  Orientation to time 0  Orientation to Place 2  Registration 3  Attention/ Calculation 2  Recall 3  Language- name 2 objects 2  Language- repeat 1  Language- follow 3 step command 3  Language- read & follow direction 1  Write a sentence 1  Copy design 0  Total score 18    GENERAL EXAM:  Patient is in no distress   CARDIOVASCULAR:  Regular rate and rhythm, no murmurs, no carotid bruits   NEUROLOGIC:  MENTAL STATUS: awake, alert, language fluent, comprehension intact, naming intact; BORDERLINE MYERSONS AND PALMOMENTAL.  CRANIAL NERVE: pupils equal and reactive to light, visual fields full to confrontation, extraocular muscles intact, no nystagmus, facial sensation and strength symmetric, uvula midline, shoulder shrug symmetric, tongue midline.  MOTOR: normal bulk and tone, full strength in the BUE, BLE  SENSORY: normal and symmetric to light touch, temperature, vibration  COORDINATION: finger-nose-finger, fine finger movements normal  REFLEXES: deep tendon reflexes present and symmetric  GAIT/STATION: SLOW ANTALGIC GAIT.  DECR RIGHT ARM SWING. STOOP POSTURE. SLOW MOVING.    DIAGNOSTIC DATA (LABS, IMAGING, TESTING) - I reviewed patient records, labs, notes, testing and imaging myself where available.  Lab Results  Component Value Date   WBC 6.5 02/15/2009   HGB 14.6 02/15/2009   HCT 43.8 02/15/2009   MCV 95.2 02/15/2009   PLT 185 02/15/2009      Component Value Date/Time   NA 142 02/15/2009 1740   K 3.5 02/15/2009 1740   CL 107 02/15/2009 1740   CO2 28 02/15/2009 1740   GLUCOSE 97 02/15/2009 1740   BUN 7 02/15/2009 1740   CREATININE 0.76 02/15/2009 1740   CALCIUM 9.1 02/15/2009  1740   GFRNONAA >60 02/15/2009 1740   GFRAA  02/15/2009 1740    >60        The eGFR has been calculated using the MDRD equation. This calculation has not been validated in all clinical situations. eGFR's persistently <60 mL/min signify possible Chronic Kidney Disease.   No results found for: CHOL No results found for: HGBA1C No results found for: VITAMINB12 No results found for: TSH   10/07/10  B12 - 138 (L)  TSH 0.64  RPR NR   09/23/11 MRI brain - mild chronic microvascular ischemia and supratentorial cortical atrophy    ASSESSMENT AND PLAN  81 y.o. year old female here with moderate dementia (progressive short-term memory loss, progressive loss of ADLs).   MMSE 02/18/10 - 25 09/09/11 - 22 12/25/12 - 22 05/03/13 - 23 11/05/13 - 22 11/11/14 - 18  Dx: moderate dementia  PLAN:  1. Home health agency referral (nursing, PT, SW)  Return in about 6 months (around 05/13/2015).  I spent 25 minutes of face to face time with patient. Greater than 50% of time was spent in counseling and coordination of care with patient. In summary we discussed advanced care planning, DNR, disease prognosis / treatment options.    Penni Bombard, MD 1/42/3953, 20:23 PM Certified in Neurology, Neurophysiology and Neuroimaging  [facility] [address], [place], [place] [address] [phone]

## 2014-11-11 NOTE — Patient Instructions (Signed)
Home health agency referral.  Caution with home living situation.

## 2014-11-17 DIAGNOSIS — E559 Vitamin D deficiency, unspecified: Secondary | ICD-10-CM | POA: Diagnosis not present

## 2014-11-17 DIAGNOSIS — Z9181 History of falling: Secondary | ICD-10-CM | POA: Diagnosis not present

## 2014-11-17 DIAGNOSIS — M25561 Pain in right knee: Secondary | ICD-10-CM | POA: Diagnosis not present

## 2014-11-17 DIAGNOSIS — M21061 Valgus deformity, not elsewhere classified, right knee: Secondary | ICD-10-CM | POA: Diagnosis not present

## 2014-11-17 DIAGNOSIS — G309 Alzheimer's disease, unspecified: Secondary | ICD-10-CM | POA: Diagnosis not present

## 2014-11-17 DIAGNOSIS — F0280 Dementia in other diseases classified elsewhere without behavioral disturbance: Secondary | ICD-10-CM | POA: Diagnosis not present

## 2014-11-19 DIAGNOSIS — M62838 Other muscle spasm: Secondary | ICD-10-CM | POA: Diagnosis not present

## 2014-11-19 DIAGNOSIS — M533 Sacrococcygeal disorders, not elsewhere classified: Secondary | ICD-10-CM | POA: Diagnosis not present

## 2014-11-19 DIAGNOSIS — R489 Unspecified symbolic dysfunctions: Secondary | ICD-10-CM | POA: Diagnosis not present

## 2014-11-19 DIAGNOSIS — M6281 Muscle weakness (generalized): Secondary | ICD-10-CM | POA: Diagnosis not present

## 2014-11-20 DIAGNOSIS — Z9181 History of falling: Secondary | ICD-10-CM | POA: Diagnosis not present

## 2014-11-20 DIAGNOSIS — F0280 Dementia in other diseases classified elsewhere without behavioral disturbance: Secondary | ICD-10-CM | POA: Diagnosis not present

## 2014-11-20 DIAGNOSIS — M25561 Pain in right knee: Secondary | ICD-10-CM | POA: Diagnosis not present

## 2014-11-20 DIAGNOSIS — M21061 Valgus deformity, not elsewhere classified, right knee: Secondary | ICD-10-CM | POA: Diagnosis not present

## 2014-11-20 DIAGNOSIS — G309 Alzheimer's disease, unspecified: Secondary | ICD-10-CM | POA: Diagnosis not present

## 2014-11-20 DIAGNOSIS — E559 Vitamin D deficiency, unspecified: Secondary | ICD-10-CM | POA: Diagnosis not present

## 2014-11-25 DIAGNOSIS — F0280 Dementia in other diseases classified elsewhere without behavioral disturbance: Secondary | ICD-10-CM | POA: Diagnosis not present

## 2014-11-25 DIAGNOSIS — G309 Alzheimer's disease, unspecified: Secondary | ICD-10-CM | POA: Diagnosis not present

## 2014-11-25 DIAGNOSIS — M25561 Pain in right knee: Secondary | ICD-10-CM | POA: Diagnosis not present

## 2014-11-25 DIAGNOSIS — E559 Vitamin D deficiency, unspecified: Secondary | ICD-10-CM | POA: Diagnosis not present

## 2014-11-25 DIAGNOSIS — M21061 Valgus deformity, not elsewhere classified, right knee: Secondary | ICD-10-CM | POA: Diagnosis not present

## 2014-11-25 DIAGNOSIS — Z9181 History of falling: Secondary | ICD-10-CM | POA: Diagnosis not present

## 2014-11-28 DIAGNOSIS — E559 Vitamin D deficiency, unspecified: Secondary | ICD-10-CM | POA: Diagnosis not present

## 2014-11-28 DIAGNOSIS — M21061 Valgus deformity, not elsewhere classified, right knee: Secondary | ICD-10-CM | POA: Diagnosis not present

## 2014-11-28 DIAGNOSIS — F0280 Dementia in other diseases classified elsewhere without behavioral disturbance: Secondary | ICD-10-CM | POA: Diagnosis not present

## 2014-11-28 DIAGNOSIS — Z9181 History of falling: Secondary | ICD-10-CM | POA: Diagnosis not present

## 2014-11-28 DIAGNOSIS — M25561 Pain in right knee: Secondary | ICD-10-CM | POA: Diagnosis not present

## 2014-11-28 DIAGNOSIS — G309 Alzheimer's disease, unspecified: Secondary | ICD-10-CM | POA: Diagnosis not present

## 2014-12-02 DIAGNOSIS — M6281 Muscle weakness (generalized): Secondary | ICD-10-CM | POA: Diagnosis not present

## 2014-12-02 DIAGNOSIS — M533 Sacrococcygeal disorders, not elsewhere classified: Secondary | ICD-10-CM | POA: Diagnosis not present

## 2014-12-02 DIAGNOSIS — R488 Other symbolic dysfunctions: Secondary | ICD-10-CM | POA: Diagnosis not present

## 2014-12-02 DIAGNOSIS — M62838 Other muscle spasm: Secondary | ICD-10-CM | POA: Diagnosis not present

## 2014-12-03 DIAGNOSIS — M21061 Valgus deformity, not elsewhere classified, right knee: Secondary | ICD-10-CM | POA: Diagnosis not present

## 2014-12-03 DIAGNOSIS — Z9181 History of falling: Secondary | ICD-10-CM | POA: Diagnosis not present

## 2014-12-03 DIAGNOSIS — F0280 Dementia in other diseases classified elsewhere without behavioral disturbance: Secondary | ICD-10-CM | POA: Diagnosis not present

## 2014-12-03 DIAGNOSIS — E559 Vitamin D deficiency, unspecified: Secondary | ICD-10-CM | POA: Diagnosis not present

## 2014-12-03 DIAGNOSIS — G309 Alzheimer's disease, unspecified: Secondary | ICD-10-CM | POA: Diagnosis not present

## 2014-12-03 DIAGNOSIS — M25561 Pain in right knee: Secondary | ICD-10-CM | POA: Diagnosis not present

## 2014-12-04 DIAGNOSIS — M1711 Unilateral primary osteoarthritis, right knee: Secondary | ICD-10-CM | POA: Diagnosis not present

## 2014-12-04 DIAGNOSIS — M533 Sacrococcygeal disorders, not elsewhere classified: Secondary | ICD-10-CM | POA: Diagnosis not present

## 2014-12-09 DIAGNOSIS — R460 Very low level of personal hygiene: Secondary | ICD-10-CM | POA: Diagnosis not present

## 2014-12-09 DIAGNOSIS — R21 Rash and other nonspecific skin eruption: Secondary | ICD-10-CM | POA: Diagnosis not present

## 2014-12-10 DIAGNOSIS — E559 Vitamin D deficiency, unspecified: Secondary | ICD-10-CM | POA: Diagnosis not present

## 2014-12-10 DIAGNOSIS — F0280 Dementia in other diseases classified elsewhere without behavioral disturbance: Secondary | ICD-10-CM | POA: Diagnosis not present

## 2014-12-10 DIAGNOSIS — Z9181 History of falling: Secondary | ICD-10-CM | POA: Diagnosis not present

## 2014-12-10 DIAGNOSIS — M21061 Valgus deformity, not elsewhere classified, right knee: Secondary | ICD-10-CM | POA: Diagnosis not present

## 2014-12-10 DIAGNOSIS — G309 Alzheimer's disease, unspecified: Secondary | ICD-10-CM | POA: Diagnosis not present

## 2014-12-10 DIAGNOSIS — M25561 Pain in right knee: Secondary | ICD-10-CM | POA: Diagnosis not present

## 2014-12-16 DIAGNOSIS — M6281 Muscle weakness (generalized): Secondary | ICD-10-CM | POA: Diagnosis not present

## 2014-12-16 DIAGNOSIS — M62838 Other muscle spasm: Secondary | ICD-10-CM | POA: Diagnosis not present

## 2014-12-16 DIAGNOSIS — M533 Sacrococcygeal disorders, not elsewhere classified: Secondary | ICD-10-CM | POA: Diagnosis not present

## 2014-12-16 DIAGNOSIS — R488 Other symbolic dysfunctions: Secondary | ICD-10-CM | POA: Diagnosis not present

## 2014-12-18 DIAGNOSIS — F0280 Dementia in other diseases classified elsewhere without behavioral disturbance: Secondary | ICD-10-CM | POA: Diagnosis not present

## 2014-12-18 DIAGNOSIS — Z9181 History of falling: Secondary | ICD-10-CM | POA: Diagnosis not present

## 2014-12-18 DIAGNOSIS — G309 Alzheimer's disease, unspecified: Secondary | ICD-10-CM | POA: Diagnosis not present

## 2014-12-18 DIAGNOSIS — M25561 Pain in right knee: Secondary | ICD-10-CM | POA: Diagnosis not present

## 2014-12-18 DIAGNOSIS — E559 Vitamin D deficiency, unspecified: Secondary | ICD-10-CM | POA: Diagnosis not present

## 2014-12-18 DIAGNOSIS — M21061 Valgus deformity, not elsewhere classified, right knee: Secondary | ICD-10-CM | POA: Diagnosis not present

## 2014-12-31 DIAGNOSIS — M62838 Other muscle spasm: Secondary | ICD-10-CM | POA: Diagnosis not present

## 2014-12-31 DIAGNOSIS — R488 Other symbolic dysfunctions: Secondary | ICD-10-CM | POA: Diagnosis not present

## 2014-12-31 DIAGNOSIS — M533 Sacrococcygeal disorders, not elsewhere classified: Secondary | ICD-10-CM | POA: Diagnosis not present

## 2014-12-31 DIAGNOSIS — M6281 Muscle weakness (generalized): Secondary | ICD-10-CM | POA: Diagnosis not present

## 2015-01-09 DIAGNOSIS — M62838 Other muscle spasm: Secondary | ICD-10-CM | POA: Diagnosis not present

## 2015-01-09 DIAGNOSIS — R488 Other symbolic dysfunctions: Secondary | ICD-10-CM | POA: Diagnosis not present

## 2015-01-21 DIAGNOSIS — R488 Other symbolic dysfunctions: Secondary | ICD-10-CM | POA: Diagnosis not present

## 2015-01-21 DIAGNOSIS — M6281 Muscle weakness (generalized): Secondary | ICD-10-CM | POA: Diagnosis not present

## 2015-01-21 DIAGNOSIS — M533 Sacrococcygeal disorders, not elsewhere classified: Secondary | ICD-10-CM | POA: Diagnosis not present

## 2015-01-21 DIAGNOSIS — M62838 Other muscle spasm: Secondary | ICD-10-CM | POA: Diagnosis not present

## 2015-02-05 DIAGNOSIS — M62838 Other muscle spasm: Secondary | ICD-10-CM | POA: Diagnosis not present

## 2015-02-05 DIAGNOSIS — R488 Other symbolic dysfunctions: Secondary | ICD-10-CM | POA: Diagnosis not present

## 2015-02-05 DIAGNOSIS — M533 Sacrococcygeal disorders, not elsewhere classified: Secondary | ICD-10-CM | POA: Diagnosis not present

## 2015-02-05 DIAGNOSIS — M6281 Muscle weakness (generalized): Secondary | ICD-10-CM | POA: Diagnosis not present

## 2015-02-12 DIAGNOSIS — M62838 Other muscle spasm: Secondary | ICD-10-CM | POA: Diagnosis not present

## 2015-02-12 DIAGNOSIS — M533 Sacrococcygeal disorders, not elsewhere classified: Secondary | ICD-10-CM | POA: Diagnosis not present

## 2015-02-12 DIAGNOSIS — R488 Other symbolic dysfunctions: Secondary | ICD-10-CM | POA: Diagnosis not present

## 2015-02-12 DIAGNOSIS — M6281 Muscle weakness (generalized): Secondary | ICD-10-CM | POA: Diagnosis not present

## 2015-02-25 DIAGNOSIS — M6281 Muscle weakness (generalized): Secondary | ICD-10-CM | POA: Diagnosis not present

## 2015-02-25 DIAGNOSIS — M533 Sacrococcygeal disorders, not elsewhere classified: Secondary | ICD-10-CM | POA: Diagnosis not present

## 2015-02-25 DIAGNOSIS — R488 Other symbolic dysfunctions: Secondary | ICD-10-CM | POA: Diagnosis not present

## 2015-02-25 DIAGNOSIS — M62838 Other muscle spasm: Secondary | ICD-10-CM | POA: Diagnosis not present

## 2015-03-10 DIAGNOSIS — R413 Other amnesia: Secondary | ICD-10-CM | POA: Diagnosis not present

## 2015-03-10 DIAGNOSIS — R296 Repeated falls: Secondary | ICD-10-CM | POA: Diagnosis not present

## 2015-03-10 DIAGNOSIS — R634 Abnormal weight loss: Secondary | ICD-10-CM | POA: Diagnosis not present

## 2015-03-10 DIAGNOSIS — E780 Pure hypercholesterolemia: Secondary | ICD-10-CM | POA: Diagnosis not present

## 2015-03-10 DIAGNOSIS — M810 Age-related osteoporosis without current pathological fracture: Secondary | ICD-10-CM | POA: Diagnosis not present

## 2015-03-10 DIAGNOSIS — E538 Deficiency of other specified B group vitamins: Secondary | ICD-10-CM | POA: Diagnosis not present

## 2015-03-11 DIAGNOSIS — M62838 Other muscle spasm: Secondary | ICD-10-CM | POA: Diagnosis not present

## 2015-03-11 DIAGNOSIS — R488 Other symbolic dysfunctions: Secondary | ICD-10-CM | POA: Diagnosis not present

## 2015-03-11 DIAGNOSIS — M533 Sacrococcygeal disorders, not elsewhere classified: Secondary | ICD-10-CM | POA: Diagnosis not present

## 2015-03-11 DIAGNOSIS — M6281 Muscle weakness (generalized): Secondary | ICD-10-CM | POA: Diagnosis not present

## 2015-03-14 DIAGNOSIS — M810 Age-related osteoporosis without current pathological fracture: Secondary | ICD-10-CM | POA: Diagnosis not present

## 2015-03-14 DIAGNOSIS — F0390 Unspecified dementia without behavioral disturbance: Secondary | ICD-10-CM | POA: Diagnosis not present

## 2015-03-14 DIAGNOSIS — E539 Vitamin B deficiency, unspecified: Secondary | ICD-10-CM | POA: Diagnosis not present

## 2015-03-14 DIAGNOSIS — M179 Osteoarthritis of knee, unspecified: Secondary | ICD-10-CM | POA: Diagnosis not present

## 2015-03-14 DIAGNOSIS — Z9181 History of falling: Secondary | ICD-10-CM | POA: Diagnosis not present

## 2015-03-14 DIAGNOSIS — H269 Unspecified cataract: Secondary | ICD-10-CM | POA: Diagnosis not present

## 2015-03-17 ENCOUNTER — Other Ambulatory Visit: Payer: Self-pay | Admitting: Diagnostic Neuroimaging

## 2015-03-17 DIAGNOSIS — H269 Unspecified cataract: Secondary | ICD-10-CM | POA: Diagnosis not present

## 2015-03-17 DIAGNOSIS — M179 Osteoarthritis of knee, unspecified: Secondary | ICD-10-CM | POA: Diagnosis not present

## 2015-03-17 DIAGNOSIS — F0390 Unspecified dementia without behavioral disturbance: Secondary | ICD-10-CM | POA: Diagnosis not present

## 2015-03-17 DIAGNOSIS — E539 Vitamin B deficiency, unspecified: Secondary | ICD-10-CM | POA: Diagnosis not present

## 2015-03-17 DIAGNOSIS — M810 Age-related osteoporosis without current pathological fracture: Secondary | ICD-10-CM | POA: Diagnosis not present

## 2015-03-17 DIAGNOSIS — Z9181 History of falling: Secondary | ICD-10-CM | POA: Diagnosis not present

## 2015-03-18 DIAGNOSIS — F0390 Unspecified dementia without behavioral disturbance: Secondary | ICD-10-CM | POA: Diagnosis not present

## 2015-03-18 DIAGNOSIS — H269 Unspecified cataract: Secondary | ICD-10-CM | POA: Diagnosis not present

## 2015-03-18 DIAGNOSIS — E539 Vitamin B deficiency, unspecified: Secondary | ICD-10-CM | POA: Diagnosis not present

## 2015-03-18 DIAGNOSIS — M179 Osteoarthritis of knee, unspecified: Secondary | ICD-10-CM | POA: Diagnosis not present

## 2015-03-18 DIAGNOSIS — M810 Age-related osteoporosis without current pathological fracture: Secondary | ICD-10-CM | POA: Diagnosis not present

## 2015-03-18 DIAGNOSIS — Z9181 History of falling: Secondary | ICD-10-CM | POA: Diagnosis not present

## 2015-03-20 DIAGNOSIS — F0390 Unspecified dementia without behavioral disturbance: Secondary | ICD-10-CM | POA: Diagnosis not present

## 2015-03-20 DIAGNOSIS — H269 Unspecified cataract: Secondary | ICD-10-CM | POA: Diagnosis not present

## 2015-03-20 DIAGNOSIS — M179 Osteoarthritis of knee, unspecified: Secondary | ICD-10-CM | POA: Diagnosis not present

## 2015-03-20 DIAGNOSIS — Z9181 History of falling: Secondary | ICD-10-CM | POA: Diagnosis not present

## 2015-03-20 DIAGNOSIS — M810 Age-related osteoporosis without current pathological fracture: Secondary | ICD-10-CM | POA: Diagnosis not present

## 2015-03-20 DIAGNOSIS — E539 Vitamin B deficiency, unspecified: Secondary | ICD-10-CM | POA: Diagnosis not present

## 2015-03-24 DIAGNOSIS — Z0000 Encounter for general adult medical examination without abnormal findings: Secondary | ICD-10-CM | POA: Diagnosis not present

## 2015-03-24 DIAGNOSIS — E538 Deficiency of other specified B group vitamins: Secondary | ICD-10-CM | POA: Diagnosis not present

## 2015-03-24 DIAGNOSIS — E780 Pure hypercholesterolemia: Secondary | ICD-10-CM | POA: Diagnosis not present

## 2015-03-24 DIAGNOSIS — R413 Other amnesia: Secondary | ICD-10-CM | POA: Diagnosis not present

## 2015-03-24 DIAGNOSIS — M810 Age-related osteoporosis without current pathological fracture: Secondary | ICD-10-CM | POA: Diagnosis not present

## 2015-03-24 DIAGNOSIS — Z1389 Encounter for screening for other disorder: Secondary | ICD-10-CM | POA: Diagnosis not present

## 2015-03-25 DIAGNOSIS — F0390 Unspecified dementia without behavioral disturbance: Secondary | ICD-10-CM | POA: Diagnosis not present

## 2015-03-25 DIAGNOSIS — M810 Age-related osteoporosis without current pathological fracture: Secondary | ICD-10-CM | POA: Diagnosis not present

## 2015-03-25 DIAGNOSIS — Z9181 History of falling: Secondary | ICD-10-CM | POA: Diagnosis not present

## 2015-03-25 DIAGNOSIS — M179 Osteoarthritis of knee, unspecified: Secondary | ICD-10-CM | POA: Diagnosis not present

## 2015-03-25 DIAGNOSIS — E539 Vitamin B deficiency, unspecified: Secondary | ICD-10-CM | POA: Diagnosis not present

## 2015-03-25 DIAGNOSIS — H269 Unspecified cataract: Secondary | ICD-10-CM | POA: Diagnosis not present

## 2015-03-26 DIAGNOSIS — F0390 Unspecified dementia without behavioral disturbance: Secondary | ICD-10-CM | POA: Diagnosis not present

## 2015-03-26 DIAGNOSIS — H269 Unspecified cataract: Secondary | ICD-10-CM | POA: Diagnosis not present

## 2015-03-26 DIAGNOSIS — E539 Vitamin B deficiency, unspecified: Secondary | ICD-10-CM | POA: Diagnosis not present

## 2015-03-26 DIAGNOSIS — M810 Age-related osteoporosis without current pathological fracture: Secondary | ICD-10-CM | POA: Diagnosis not present

## 2015-03-26 DIAGNOSIS — Z9181 History of falling: Secondary | ICD-10-CM | POA: Diagnosis not present

## 2015-03-26 DIAGNOSIS — M179 Osteoarthritis of knee, unspecified: Secondary | ICD-10-CM | POA: Diagnosis not present

## 2015-03-31 DIAGNOSIS — F0390 Unspecified dementia without behavioral disturbance: Secondary | ICD-10-CM | POA: Diagnosis not present

## 2015-03-31 DIAGNOSIS — H269 Unspecified cataract: Secondary | ICD-10-CM | POA: Diagnosis not present

## 2015-03-31 DIAGNOSIS — M179 Osteoarthritis of knee, unspecified: Secondary | ICD-10-CM | POA: Diagnosis not present

## 2015-03-31 DIAGNOSIS — E539 Vitamin B deficiency, unspecified: Secondary | ICD-10-CM | POA: Diagnosis not present

## 2015-03-31 DIAGNOSIS — M810 Age-related osteoporosis without current pathological fracture: Secondary | ICD-10-CM | POA: Diagnosis not present

## 2015-03-31 DIAGNOSIS — Z9181 History of falling: Secondary | ICD-10-CM | POA: Diagnosis not present

## 2015-04-01 DIAGNOSIS — M810 Age-related osteoporosis without current pathological fracture: Secondary | ICD-10-CM | POA: Diagnosis not present

## 2015-04-01 DIAGNOSIS — E539 Vitamin B deficiency, unspecified: Secondary | ICD-10-CM | POA: Diagnosis not present

## 2015-04-01 DIAGNOSIS — H269 Unspecified cataract: Secondary | ICD-10-CM | POA: Diagnosis not present

## 2015-04-01 DIAGNOSIS — M179 Osteoarthritis of knee, unspecified: Secondary | ICD-10-CM | POA: Diagnosis not present

## 2015-04-01 DIAGNOSIS — Z9181 History of falling: Secondary | ICD-10-CM | POA: Diagnosis not present

## 2015-04-01 DIAGNOSIS — F0390 Unspecified dementia without behavioral disturbance: Secondary | ICD-10-CM | POA: Diagnosis not present

## 2015-04-03 DIAGNOSIS — H269 Unspecified cataract: Secondary | ICD-10-CM | POA: Diagnosis not present

## 2015-04-03 DIAGNOSIS — E539 Vitamin B deficiency, unspecified: Secondary | ICD-10-CM | POA: Diagnosis not present

## 2015-04-03 DIAGNOSIS — Z9181 History of falling: Secondary | ICD-10-CM | POA: Diagnosis not present

## 2015-04-03 DIAGNOSIS — M179 Osteoarthritis of knee, unspecified: Secondary | ICD-10-CM | POA: Diagnosis not present

## 2015-04-03 DIAGNOSIS — M810 Age-related osteoporosis without current pathological fracture: Secondary | ICD-10-CM | POA: Diagnosis not present

## 2015-04-03 DIAGNOSIS — F0390 Unspecified dementia without behavioral disturbance: Secondary | ICD-10-CM | POA: Diagnosis not present

## 2015-04-06 DIAGNOSIS — F0390 Unspecified dementia without behavioral disturbance: Secondary | ICD-10-CM | POA: Diagnosis not present

## 2015-04-06 DIAGNOSIS — M179 Osteoarthritis of knee, unspecified: Secondary | ICD-10-CM | POA: Diagnosis not present

## 2015-04-06 DIAGNOSIS — M810 Age-related osteoporosis without current pathological fracture: Secondary | ICD-10-CM | POA: Diagnosis not present

## 2015-04-06 DIAGNOSIS — H269 Unspecified cataract: Secondary | ICD-10-CM | POA: Diagnosis not present

## 2015-04-06 DIAGNOSIS — Z9181 History of falling: Secondary | ICD-10-CM | POA: Diagnosis not present

## 2015-04-06 DIAGNOSIS — E539 Vitamin B deficiency, unspecified: Secondary | ICD-10-CM | POA: Diagnosis not present

## 2015-04-07 DIAGNOSIS — M810 Age-related osteoporosis without current pathological fracture: Secondary | ICD-10-CM | POA: Diagnosis not present

## 2015-04-07 DIAGNOSIS — H269 Unspecified cataract: Secondary | ICD-10-CM | POA: Diagnosis not present

## 2015-04-07 DIAGNOSIS — E539 Vitamin B deficiency, unspecified: Secondary | ICD-10-CM | POA: Diagnosis not present

## 2015-04-07 DIAGNOSIS — Z9181 History of falling: Secondary | ICD-10-CM | POA: Diagnosis not present

## 2015-04-07 DIAGNOSIS — M179 Osteoarthritis of knee, unspecified: Secondary | ICD-10-CM | POA: Diagnosis not present

## 2015-04-07 DIAGNOSIS — F0390 Unspecified dementia without behavioral disturbance: Secondary | ICD-10-CM | POA: Diagnosis not present

## 2015-04-10 DIAGNOSIS — E539 Vitamin B deficiency, unspecified: Secondary | ICD-10-CM | POA: Diagnosis not present

## 2015-04-10 DIAGNOSIS — M179 Osteoarthritis of knee, unspecified: Secondary | ICD-10-CM | POA: Diagnosis not present

## 2015-04-10 DIAGNOSIS — H269 Unspecified cataract: Secondary | ICD-10-CM | POA: Diagnosis not present

## 2015-04-10 DIAGNOSIS — Z9181 History of falling: Secondary | ICD-10-CM | POA: Diagnosis not present

## 2015-04-10 DIAGNOSIS — F0390 Unspecified dementia without behavioral disturbance: Secondary | ICD-10-CM | POA: Diagnosis not present

## 2015-04-10 DIAGNOSIS — M810 Age-related osteoporosis without current pathological fracture: Secondary | ICD-10-CM | POA: Diagnosis not present

## 2015-04-13 DIAGNOSIS — Z9181 History of falling: Secondary | ICD-10-CM | POA: Diagnosis not present

## 2015-04-13 DIAGNOSIS — M810 Age-related osteoporosis without current pathological fracture: Secondary | ICD-10-CM | POA: Diagnosis not present

## 2015-04-13 DIAGNOSIS — H269 Unspecified cataract: Secondary | ICD-10-CM | POA: Diagnosis not present

## 2015-04-13 DIAGNOSIS — M179 Osteoarthritis of knee, unspecified: Secondary | ICD-10-CM | POA: Diagnosis not present

## 2015-04-13 DIAGNOSIS — F0390 Unspecified dementia without behavioral disturbance: Secondary | ICD-10-CM | POA: Diagnosis not present

## 2015-04-13 DIAGNOSIS — E539 Vitamin B deficiency, unspecified: Secondary | ICD-10-CM | POA: Diagnosis not present

## 2015-04-15 DIAGNOSIS — M810 Age-related osteoporosis without current pathological fracture: Secondary | ICD-10-CM | POA: Diagnosis not present

## 2015-04-15 DIAGNOSIS — Z9181 History of falling: Secondary | ICD-10-CM | POA: Diagnosis not present

## 2015-04-15 DIAGNOSIS — F0390 Unspecified dementia without behavioral disturbance: Secondary | ICD-10-CM | POA: Diagnosis not present

## 2015-04-15 DIAGNOSIS — E539 Vitamin B deficiency, unspecified: Secondary | ICD-10-CM | POA: Diagnosis not present

## 2015-04-15 DIAGNOSIS — M179 Osteoarthritis of knee, unspecified: Secondary | ICD-10-CM | POA: Diagnosis not present

## 2015-04-15 DIAGNOSIS — H269 Unspecified cataract: Secondary | ICD-10-CM | POA: Diagnosis not present

## 2015-05-18 ENCOUNTER — Encounter: Payer: Self-pay | Admitting: Diagnostic Neuroimaging

## 2015-05-18 ENCOUNTER — Ambulatory Visit (INDEPENDENT_AMBULATORY_CARE_PROVIDER_SITE_OTHER): Payer: Commercial Managed Care - HMO | Admitting: Diagnostic Neuroimaging

## 2015-05-18 VITALS — BP 96/61 | HR 83 | Wt 118.0 lb

## 2015-05-18 DIAGNOSIS — R269 Unspecified abnormalities of gait and mobility: Secondary | ICD-10-CM

## 2015-05-18 DIAGNOSIS — F03B0 Unspecified dementia, moderate, without behavioral disturbance, psychotic disturbance, mood disturbance, and anxiety: Secondary | ICD-10-CM

## 2015-05-18 DIAGNOSIS — F0390 Unspecified dementia without behavioral disturbance: Secondary | ICD-10-CM

## 2015-05-18 NOTE — Progress Notes (Signed)
[facility]  PATIENT: Savannah Garrison DOB: 29-Sep-1929  REFERRING CLINICIAN:  HISTORY FROM: patient, husband, daughter REASON FOR VISIT: follow up    HISTORICAL  CHIEF COMPLAINT:  Chief Complaint  Patient presents with  . Dementia    rm 7, husband- Yvone Neu, dgtrPamala Hurry, MMSE 13  . Follow-up    6 months    HISTORY OF PRESENT ILLNESS:   UPDATE 05/18/15: Since last visit, continued decline. No longer driving. Minimal use of toaster. No more major cooking. Still light yard work. Memory loss is progression.  UPDATE 11/05/13 (VRP): Doing well. No new issues. Still cooks, cleans, cleans yard, drives a little. Tolerating donepezil.  UPDATE 05/03/13 (LL): since last visit, no significant change. Patient did not tolerate Namenda due to stomach upset, confusion. They have not followed up on B12 as requested. BP is much better in office today, 143/83. Patient has no complaints.  UPDATE 12/25/12: since last visit, patient has had progression of memory loss. This is noted by patient and her family. She is misplacing more objects in the home and kitchen. She is repeating herself more often. 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 patient is tolerating donepezil.   UPDATE 09/09/11: Memory loss has slightly progressed. Forgeting names of people. Repeats herself more. More problems in unfamiliar situations. Still drives without problems, although no one has been in the car with her when she is driving. No probs with cooking.   PRIOR HPI (02/18/10): 81 year old right-handed female with no significant past medical history presenting for evaluation of memory difficulty over the past few months. He is accompanied by her husband at this visit.   Patient reports some difficulty in conversation where in mid sentence she forgets what word to say next. This has been intermittent over the  past few months. She thinks it's been less than 6 months. She is concerned because her father and paternal uncle both were diagnosed with Alzheimer's disease in their 43s. Patient has noticed the symptoms herself, but her husband and her daughter have noticed it more. Patient is not that bothered by her symptoms. She continues to drive, is able to shop, maintains household finances and also does cooking and other house chores.   REVIEW OF SYSTEMS: Full 14 system review of systems performed and notable only as per HPI.   ALLERGIES: No Known Allergies  HOME MEDICATIONS: Outpatient Prescriptions Prior to Visit  Medication Sig Dispense Refill  . aspirin EC 81 MG tablet Take 81 mg by mouth daily.    Marland Kitchen donepezil (ARICEPT) 10 MG tablet TAKE 1 TABLET BY MOUTH EVERY DAY AT BEDTIME 90 tablet 0  . Vitamin D, Ergocalciferol, (DRISDOL) 50000 UNITS CAPS capsule Take 50,000 Units by mouth every 7 (seven) days.     No facility-administered medications prior to visit.    PAST MEDICAL HISTORY: Past Medical History  Diagnosis Date  . MCI (mild cognitive impairment)     PAST SURGICAL HISTORY: Past Surgical History  Procedure Laterality Date  . Dilation and curettage of uterus    . Tonsillectomy and adenoidectomy      FAMILY HISTORY: Family History  Problem Relation Age of Onset  . Lung cancer Mother   . Alzheimer's disease Father     SOCIAL HISTORY:  Social History   Social History  . Marital Status: Married    Spouse Name: Aldrad  . Number of Children:  2  . Years of Education: College   Occupational History  . Retired    Social History Main Topics  . Smoking status: Never Smoker   . Smokeless tobacco: Never Used  . Alcohol Use: 0.0 oz/week    0 Standard drinks or equivalent per week     Comment: Burbon every night.   . Drug Use: No  . Sexual Activity: Not on file   Other Topics Concern  . Not on file   Social History Narrative   Pt lives at home with spouse. Warrick Parisian)    Has been married for 53+ years.   Education [employer].   Right handed.   Caffeine Use: 1-2 glasses daily (green tea)     PHYSICAL EXAM  Filed Vitals:   05/18/15 1034  BP: 96/61  Pulse: 83  Weight: 118 lb (53.524 kg)    Not recorded      Body mass index is 22.31 kg/(m^2).  MMSE - Mini Mental State Exam 05/18/2015 11/11/2014  Orientation to time 1 0  Orientation to Place 2 2  Registration 3 3  Attention/ Calculation 1 2  Recall 0 3  Language- name 2 objects 2 2  Language- repeat 0 1  Language- follow 3 step command 3 3  Language- read & follow direction 1 1  Write a sentence 0 1  Copy design 0 0  Total score 13 18    GENERAL EXAM:  Patient is in no distress   CARDIOVASCULAR:  Regular rate and rhythm, no murmurs, no carotid bruits   NEUROLOGIC:  MENTAL STATUS: awake, alert, language fluent, comprehension intact, naming intact; BORDERLINE MYERSONS AND PALMOMENTAL.  CRANIAL NERVE: pupils equal and reactive to light, visual fields full to confrontation, extraocular muscles intact, no nystagmus, facial sensation and strength symmetric, uvula midline, shoulder shrug symmetric, tongue midline.  MOTOR: normal bulk and tone, full strength in the BUE, BLE  SENSORY: normal and symmetric to light touch, temperature, vibration  COORDINATION: finger-nose-finger, fine finger movements normal  REFLEXES: deep tendon reflexes present and symmetric  GAIT/STATION: SLOW ANTALGIC GAIT. UNSTEADY, FALLING BACKWARD, USING SINGLE POINT CANE. DECR RIGHT ARM SWING. STOOP POSTURE. SLOW MOVING.    DIAGNOSTIC DATA (LABS, IMAGING, TESTING) - I reviewed patient records, labs, notes, testing and imaging myself where available.  Lab Results  Component Value Date   WBC 6.5 02/15/2009   HGB 14.6 02/15/2009   HCT 43.8 02/15/2009   MCV 95.2 02/15/2009   PLT 185 02/15/2009      Component Value Date/Time   NA 142 02/15/2009 1740   K 3.5 02/15/2009 1740   CL 107 02/15/2009 1740   CO2 28  02/15/2009 1740   GLUCOSE 97 02/15/2009 1740   BUN 7 02/15/2009 1740   CREATININE 0.76 02/15/2009 1740   CALCIUM 9.1 02/15/2009 1740   GFRNONAA >60 02/15/2009 1740   GFRAA  02/15/2009 1740    >60        The eGFR has been calculated using the MDRD equation. This calculation has not been validated in all clinical situations. eGFR's persistently <60 mL/min signify possible Chronic Kidney Disease.   No results found for: CHOL No results found for: HGBA1C No results found for: VITAMINB12 No results found for: TSH   10/07/10  B12 - 138 (L)  TSH 0.64  RPR NR   09/23/11 MRI brain - mild chronic microvascular ischemia and supratentorial cortical atrophy    ASSESSMENT AND PLAN  81 y.o. year old female here with moderate dementia (progressive short-term memory  loss, progressive loss of ADLs).   MMSE 02/18/10 - 25 09/09/11 - 22 12/25/12 - 22 05/03/13 - 23 11/05/13 - 22 11/11/14 - 18 05/18/15 - 13  Dx: moderate dementia   PLAN:  1. Consider home aid, respite care, adult enrichment center 2. Use rollator walker; patient is high fall risk 3. Follow up with PCP   Return if symptoms worsen or fail to improve, for return to PCP.    Penni Bombard, MD 83/01/4599, 29:84 AM Certified in Neurology, Neurophysiology and Neuroimaging  [facility] [address], [place], [place] [address] [phone]

## 2015-05-18 NOTE — Patient Instructions (Signed)
Thank you for coming to see [place] at [facility]. I hope we have been able to provide you high quality care today.  You may receive a patient satisfaction survey over the next few weeks. We would appreciate your feedback and comments so that we may continue to improve ourselves and the health of our patients.  - use rollator walker - consider home care aid to help with activities of daily living   ~~~~~~~~~~~~~~~~~~~~~~~~~~~~~~~~~~~~~~~~~~~~~~~~~~~~~~~~~~~~~~~~~  DR. Noelly Lasseigne'S GUIDE TO HAPPY AND HEALTHY LIVING These are some of my general health and wellness recommendations. Some of them may apply to you better than others. Please use common sense as you try these suggestions and feel free to ask me any questions.   ACTIVITY/FITNESS Mental, social, emotional and physical stimulation are very important for brain and body health. Try learning a new activity (arts, music, language, sports, games).  Keep moving your body to the best of your abilities. Consider physical therapist or personal trainer to get started.    NUTRITION Eat more plants: colorful vegetables, nuts, seeds and berries.  Eat less sugar, salt, preservatives and processed foods.  Avoid toxins such as cigarettes and alcohol.  Drink water when you are thirsty. Warm water with a slice of lemon is an excellent morning drink to start the day.  Consider these websites for more information The Nutrition Source (https://www.henry-hernandez.biz/) Precision Nutrition (WindowBlog.ch)   RELAXATION Consider practicing mindfulness meditation or other relaxation techniques such as deep breathing, prayer, yoga, tai chi, massage.   SLEEP Try to get at least 7-8+ hours sleep per day. Practice good sleep hygeine techniques. See website sleep.org for more information.   PLANNING Prepare estate planning, living will, healthcare POA documents. Sometimes this is best  planned with the help of an attorney. Theconversationproject.org and agingwithdignity.org are excellent resources.

## 2015-05-28 DIAGNOSIS — M533 Sacrococcygeal disorders, not elsewhere classified: Secondary | ICD-10-CM | POA: Diagnosis not present

## 2015-05-31 ENCOUNTER — Emergency Department (HOSPITAL_COMMUNITY): Payer: Commercial Managed Care - HMO

## 2015-05-31 ENCOUNTER — Encounter (HOSPITAL_COMMUNITY): Payer: Self-pay | Admitting: Emergency Medicine

## 2015-05-31 ENCOUNTER — Inpatient Hospital Stay (HOSPITAL_COMMUNITY): Payer: Commercial Managed Care - HMO

## 2015-05-31 ENCOUNTER — Inpatient Hospital Stay (HOSPITAL_COMMUNITY)
Admission: EM | Admit: 2015-05-31 | Discharge: 2015-06-02 | DRG: 305 | Disposition: A | Discharge status: Home / Self Care | Payer: Commercial Managed Care - HMO | Attending: Family Medicine | Admitting: Family Medicine

## 2015-05-31 DIAGNOSIS — R413 Other amnesia: Secondary | ICD-10-CM | POA: Diagnosis present

## 2015-05-31 DIAGNOSIS — F0391 Unspecified dementia with behavioral disturbance: Secondary | ICD-10-CM | POA: Diagnosis present

## 2015-05-31 DIAGNOSIS — F0390 Unspecified dementia without behavioral disturbance: Secondary | ICD-10-CM | POA: Diagnosis present

## 2015-05-31 DIAGNOSIS — R7989 Other specified abnormal findings of blood chemistry: Secondary | ICD-10-CM | POA: Diagnosis not present

## 2015-05-31 DIAGNOSIS — E559 Vitamin D deficiency, unspecified: Secondary | ICD-10-CM | POA: Diagnosis not present

## 2015-05-31 DIAGNOSIS — I674 Hypertensive encephalopathy: Secondary | ICD-10-CM | POA: Diagnosis not present

## 2015-05-31 DIAGNOSIS — I428 Other cardiomyopathies: Secondary | ICD-10-CM | POA: Diagnosis not present

## 2015-05-31 DIAGNOSIS — M549 Dorsalgia, unspecified: Secondary | ICD-10-CM | POA: Diagnosis present

## 2015-05-31 DIAGNOSIS — Z801 Family history of malignant neoplasm of trachea, bronchus and lung: Secondary | ICD-10-CM

## 2015-05-31 DIAGNOSIS — Z7982 Long term (current) use of aspirin: Secondary | ICD-10-CM

## 2015-05-31 DIAGNOSIS — N390 Urinary tract infection, site not specified: Secondary | ICD-10-CM | POA: Diagnosis not present

## 2015-05-31 DIAGNOSIS — Z79899 Other long term (current) drug therapy: Secondary | ICD-10-CM

## 2015-05-31 DIAGNOSIS — H539 Unspecified visual disturbance: Secondary | ICD-10-CM | POA: Diagnosis not present

## 2015-05-31 DIAGNOSIS — R42 Dizziness and giddiness: Secondary | ICD-10-CM | POA: Diagnosis not present

## 2015-05-31 DIAGNOSIS — R778 Other specified abnormalities of plasma proteins: Secondary | ICD-10-CM | POA: Diagnosis present

## 2015-05-31 DIAGNOSIS — Z820 Family history of epilepsy and other diseases of the nervous system: Secondary | ICD-10-CM

## 2015-05-31 DIAGNOSIS — Z961 Presence of intraocular lens: Secondary | ICD-10-CM | POA: Diagnosis present

## 2015-05-31 DIAGNOSIS — R748 Abnormal levels of other serum enzymes: Secondary | ICD-10-CM | POA: Diagnosis present

## 2015-05-31 DIAGNOSIS — Z8673 Personal history of transient ischemic attack (TIA), and cerebral infarction without residual deficits: Secondary | ICD-10-CM | POA: Diagnosis not present

## 2015-05-31 DIAGNOSIS — M533 Sacrococcygeal disorders, not elsewhere classified: Secondary | ICD-10-CM | POA: Diagnosis present

## 2015-05-31 DIAGNOSIS — I10 Essential (primary) hypertension: Secondary | ICD-10-CM | POA: Diagnosis not present

## 2015-05-31 DIAGNOSIS — I160 Hypertensive urgency: Principal | ICD-10-CM | POA: Diagnosis present

## 2015-05-31 DIAGNOSIS — F03B18 Unspecified dementia, moderate, with other behavioral disturbance: Secondary | ICD-10-CM | POA: Diagnosis present

## 2015-05-31 HISTORY — DX: Vitamin D deficiency, unspecified: E55.9

## 2015-05-31 LAB — BASIC METABOLIC PANEL
ANION GAP: 7 (ref 5–15)
BUN: 10 mg/dL (ref 6–20)
CALCIUM: 9.2 mg/dL (ref 8.9–10.3)
CHLORIDE: 105 mmol/L (ref 101–111)
CO2: 29 mmol/L (ref 22–32)
Creatinine, Ser: 0.74 mg/dL (ref 0.44–1.00)
GFR calc non Af Amer: >60 mL/min (ref >60)
Glucose, Bld: 132 mg/dL — ABNORMAL HIGH (ref 65–99)
Potassium: 3.9 mmol/L (ref 3.5–5.1)
SODIUM: 141 mmol/L (ref 135–145)

## 2015-05-31 LAB — URINE MICROSCOPIC-ADD ON

## 2015-05-31 LAB — URINALYSIS, ROUTINE W REFLEX MICROSCOPIC
Bilirubin Urine: NEGATIVE
GLUCOSE, UA: NEGATIVE mg/dL
KETONES UR: 15 mg/dL — AB
NITRITE: NEGATIVE
PROTEIN: 30 mg/dL — AB
Specific Gravity, Urine: 1.018 (ref 1.005–1.030)
Urobilinogen, UA: 1 mg/dL (ref 0.0–1.0)
pH: 6.5 (ref 5.0–8.0)

## 2015-05-31 LAB — CBC
HCT: 45 % (ref 36.0–46.0)
HEMOGLOBIN: 14.7 g/dL (ref 12.0–15.0)
MCH: 30.4 pg (ref 26.0–34.0)
MCHC: 32.7 g/dL (ref 30.0–36.0)
MCV: 93 fL (ref 78.0–100.0)
PLATELETS: 250 10*3/uL (ref 150–400)
RBC: 4.84 MIL/uL (ref 3.87–5.11)
RDW: 15.1 % (ref 11.5–15.5)
WBC: 10.5 10*3/uL (ref 4.0–10.5)

## 2015-05-31 LAB — TROPONIN I
TROPONIN I: 0.44 ng/mL — AB (ref <0.031)
TROPONIN I: 1.52 ng/mL — AB (ref <0.031)

## 2015-05-31 LAB — CBG MONITORING, ED: GLUCOSE-CAPILLARY: 97 mg/dL (ref 65–99)

## 2015-05-31 MED ORDER — DONEPEZIL HCL 10 MG PO TABS
10.0000 mg | ORAL_TABLET | Freq: Every day | ORAL | Status: DC
  Administered 2015-05-31: 10 mg via ORAL
  Filled 2015-05-31 (×2): qty 1

## 2015-05-31 MED ORDER — HYDRALAZINE HCL 20 MG/ML IJ SOLN
5.0000 mg | INTRAMUSCULAR | Status: DC | PRN
  Administered 2015-05-31: 5 mg via INTRAVENOUS
  Filled 2015-05-31 (×2): qty 1

## 2015-05-31 MED ORDER — SODIUM CHLORIDE 0.9 % IJ SOLN
3.0000 mL | Freq: Two times a day (BID) | INTRAMUSCULAR | Status: DC
  Administered 2015-05-31 – 2015-06-02 (×3): 3 mL via INTRAVENOUS

## 2015-05-31 MED ORDER — HYDROCODONE-ACETAMINOPHEN 5-325 MG PO TABS
1.0000 | ORAL_TABLET | Freq: Four times a day (QID) | ORAL | Status: DC | PRN
  Administered 2015-05-31 – 2015-06-01 (×3): 1 via ORAL
  Filled 2015-05-31 (×3): qty 1

## 2015-05-31 MED ORDER — ASPIRIN 325 MG PO TABS
325.0000 mg | ORAL_TABLET | Freq: Every day | ORAL | Status: DC
  Administered 2015-05-31: 325 mg via ORAL
  Filled 2015-05-31 (×2): qty 1

## 2015-05-31 MED ORDER — METOPROLOL TARTRATE 25 MG PO TABS: 12.5000 mg | ORAL_TABLET | Freq: Two times a day (BID) | ORAL | Status: DC

## 2015-05-31 MED ORDER — DEXTROSE 5 % IV SOLN
1.0000 g | INTRAVENOUS | Status: DC
  Administered 2015-06-01 – 2015-06-02 (×2): 1 g via INTRAVENOUS
  Filled 2015-05-31 (×3): qty 10

## 2015-05-31 MED ORDER — HEPARIN SODIUM (PORCINE) 5000 UNIT/ML IJ SOLN
5000.0000 [IU] | Freq: Three times a day (TID) | INTRAMUSCULAR | Status: DC
  Administered 2015-05-31 – 2015-06-02 (×4): 5000 [IU] via SUBCUTANEOUS
  Filled 2015-05-31 (×6): qty 1

## 2015-05-31 NOTE — Progress Notes (Signed)
CRITICAL VALUE ALERT  Critical value received:  2320  Date of notification:  05/31/2015   Time of notification:  2320  Critical value read back: yes  Nurse who received alert:  Verdene Rio, RN  MD notified (1st page):  [place]   Time of first page:  2334  MD notified (2nd page):  Time of second page:  Responding MD:  [place]  Time MD responded:  2340

## 2015-05-31 NOTE — ED Notes (Signed)
Pt is ready to be admitted to the unit, however will be in ED room until her MRI is completed.

## 2015-05-31 NOTE — ED Notes (Signed)
Pt reported having lightheadedness/dizziness, visual disturbances started earlier today.  Pt denies headache, nausea or LOC. Pt loss her balance and fell this morning and denies hitting her head. Pt BP-219/83 in triage. Pt does not take BP medication or dx with hypertension

## 2015-05-31 NOTE — H&P (Signed)
[facility] History and Physical  ERENDIRA CRABTREE WUJ:[id number] DOB: February 03, 1930 DOA: 05/31/2015  Referring physician: EDP PCP: [name], MD   Chief Complaint: Dizziness   HPI: Savannah Garrison is a 81 y.o. female who presents to the ED with c/o dizziness and HTN.  Patient does not have a H/O HTN in the past.  Today she had 1 min of dizziness which prompted her husband to check her BP.  BP was over 200 systolic.  Which prompted them to bring her to the ED.  Dizziness has resolved.  In the ED BP initially 219/83, came down without treatment to now 186/95.  Patient reports no: chest pain, SOB, headache.  She does report ongoing coccyx pain for the past 1 week but that's about it.  Troponin was found to be elevated at 0.44 in the ED.  Review of Systems: Systems reviewed.  As above, otherwise negative  Past Medical History  Diagnosis Date  . MCI (mild cognitive impairment)   . Vitamin D deficiency    Past Surgical History  Procedure Laterality Date  . Dilation and curettage of uterus    . Tonsillectomy and adenoidectomy     Social History:  reports that she has never smoked. She has never used smokeless tobacco. She reports that she drinks alcohol. She reports that she does not use illicit drugs.  No Known Allergies  Family History  Problem Relation Age of Onset  . Lung cancer Mother   . Alzheimer's disease Father      Prior to Admission medications   Medication Sig Start Date End Date Taking? Authorizing Provider  aspirin EC 81 MG tablet Take 81 mg by mouth daily.   Yes Historical Provider, MD  donepezil (ARICEPT) 10 MG tablet TAKE 1 TABLET BY MOUTH EVERY DAY AT BEDTIME 03/17/15  Yes Suanne Marker, MD  meloxicam (MOBIC) 7.5 MG tablet Take 7.5 mg by mouth daily. 05/28/15  Yes Historical Provider, MD   Physical Exam: Filed Vitals:   05/31/15 1940  BP: 186/95  Pulse:   Temp:   Resp: 22    BP 186/95 mmHg  Pulse 72  Temp(Src) 97.5 F (36.4 C)  (Oral)  Resp 22  SpO2 100%  General Appearance:    Alert, oriented, no distress, appears stated age  Head:    Normocephalic, atraumatic  Eyes:    PERRL, EOMI, sclera non-icteric        Nose:   Nares without drainage or epistaxis. Mucosa, turbinates normal  Throat:   Moist mucous membranes. Oropharynx without erythema or exudate.  Neck:   Supple. No carotid bruits.  No thyromegaly.  No lymphadenopathy.   Back:     No CVA tenderness, no spinal tenderness  Lungs:     Clear to auscultation bilaterally, without wheezes, rhonchi or rales  Chest wall:    No tenderness to palpitation  Heart:    Regular rate and rhythm without murmurs, gallops, rubs  Abdomen:     Soft, non-tender, nondistended, normal bowel sounds, no organomegaly  Genitalia:    deferred  Rectal:    deferred  Extremities:   No clubbing, cyanosis or edema.  Pulses:   2+ and symmetric all extremities  Skin:   Skin color, texture, turgor normal, no rashes or lesions  Lymph nodes:   Cervical, supraclavicular, and axillary nodes normal  Neurologic:   CNII-XII intact. Normal strength, sensation and reflexes      throughout    Labs on Admission:  Basic Metabolic Panel:  Recent Labs Lab 05/31/15 1706  NA 141  K 3.9  CL 105  CO2 29  GLUCOSE 132*  BUN 10  CREATININE 0.74  CALCIUM 9.2   Liver Function Tests: No results for input(s): AST, ALT, ALKPHOS, BILITOT, PROT, ALBUMIN in the last 168 hours. No results for input(s): LIPASE, AMYLASE in the last 168 hours. No results for input(s): AMMONIA in the last 168 hours. CBC:  Recent Labs Lab 05/31/15 1706  WBC 10.5  HGB 14.7  HCT 45.0  MCV 93.0  PLT 250   Cardiac Enzymes:  Recent Labs Lab 05/31/15 1710  TROPONINI 0.44*    BNP (last 3 results) No results for input(s): PROBNP in the last 8760 hours. CBG:  Recent Labs Lab 05/31/15 1649  GLUCAP 97    Radiological Exams on Admission: Dg Chest 2 View  05/31/2015  CLINICAL DATA:  Patient with hypertension  and altered mental status. EXAM: CHEST  2 VIEW COMPARISON:  Chest radiograph 03/15/2005 FINDINGS: Monitoring leads overlie the patient. Stable cardiac mediastinal contours. No consolidative pulmonary opacities. No pleural effusion or pneumothorax. Regional skeleton is unremarkable. IMPRESSION: No acute cardiopulmonary process. Electronically Signed   By: Annia Belt M.D.   On: 05/31/2015 18:05   Ct Head Wo Contrast  05/31/2015  CLINICAL DATA:  Patient with lightheadedness and dizziness. Visual disturbances. No headache. No reported loss of consciousness. EXAM: CT HEAD WITHOUT CONTRAST TECHNIQUE: Contiguous axial images were obtained from the base of the skull through the vertex without intravenous contrast. COMPARISON:  Brain CT 07/14/2012 FINDINGS: Ventricles and sulci are appropriate for patient's age. Periventricular and subcortical white matter hypodensity compatible with chronic small vessel ischemic changes. No evidence for acute cortically based infarct, intracranial hemorrhage, mass lesion mass-effect. Paranasal sinuses unremarkable. Mastoid air cells unremarkable. Calvarium is intact. Orbits are unremarkable. IMPRESSION: No acute intracranial process. Chronic small vessel ischemic changes. Electronically Signed   By: Annia Belt M.D.   On: 05/31/2015 18:17    EKG: Independently reviewed.  Assessment/Plan Active Problems:   Dizziness   Elevated troponin   Hypertensive urgency   1. Dizziness - 1. ASA 325 ordered 2. MRI brain ordered to r/o a small stroke 2. Hypertensive urgency with positive troponin - 1. IV hydralazine PRN with goal SBP < 180 2. Serial trops 3. Tele monitor 4. ASA ordered 5. Will hold off on heparin etc for now given that she is completely asymptomatic from cardiac standpoint, EKG non-ischemic    Code Status: Full  Family Communication: Family at bedside Disposition Plan: Admit to inpatient   Time spent: 70 min  Veronia Laprise M. [facility] Pager  [phone]  If 7AM-7PM, please contact the day team taking care of the patient [email or web address] Password [email or web address] 05/31/2015, 7:45 PM

## 2015-05-31 NOTE — ED Notes (Signed)
Called lab and spoke with [place]. Verified that can add on troponin to blood that is already down in lab.

## 2015-05-31 NOTE — ED Notes (Signed)
Pt  bp   204/110     219/83

## 2015-05-31 NOTE — ED Notes (Signed)
Patient transported to CT 

## 2015-05-31 NOTE — Significant Event (Signed)
X ray coccyx ordered given 1 week of pain there.  Also treating UTI that came back on UA.

## 2015-05-31 NOTE — ED Provider Notes (Addendum)
CSN: [id number]     Arrival date & time 05/31/15  1619 History   First MD Initiated Contact with Patient 05/31/15 1705     Chief Complaint  Patient presents with  . Dizziness     (Consider location/radiation/quality/duration/timing/severity/associated sxs/prior Treatment) HPI   Patient is an 81 year old female presenting with high blood pressure. Patient's husband who has low blood pressure took patient's blood pressure today and found it to be very elevated.  Patient's been having coccyx pain for the last week and was seen by her primary care physician. She started on meloxicam. She took 1 meloxicam today. She felt a little bit dizzy afterwards and that's what prompted patient's husband to take her blood pressure.  Of note her blood pressure from last visit a week ago for the coccyx pain was normal, 130/85.  Patient had that mild dizziness which prompted the blood pressure check. However after 1 minute the dizziness went away and patient is felt fine ever since. Patient's had no headaches, no trouble breathing, no chest pain, no trouble urinating.  Past Medical History  Diagnosis Date  . MCI (mild cognitive impairment)   . Vitamin D deficiency    Past Surgical History  Procedure Laterality Date  . Dilation and curettage of uterus    . Tonsillectomy and adenoidectomy     Family History  Problem Relation Age of Onset  . Lung cancer Mother   . Alzheimer's disease Father    Social History  Substance Use Topics  . Smoking status: Never Smoker   . Smokeless tobacco: Never Used  . Alcohol Use: 0.0 oz/week    0 Standard drinks or equivalent per week     Comment: Burbon every night.    OB History    No data available     Review of Systems  Constitutional: Negative for activity change.  HENT: Negative for congestion.   Respiratory: Negative for shortness of breath.   Cardiovascular: Negative for chest pain.  Gastrointestinal: Negative for abdominal pain.  Genitourinary:  Negative for dysuria.  Neurological: Positive for dizziness. Negative for seizures.  Psychiatric/Behavioral: Negative for agitation.      Allergies  Review of patient's allergies indicates no known allergies.  Home Medications   Prior to Admission medications   Medication Sig Start Date End Date Taking? Authorizing Provider  aspirin EC 81 MG tablet Take 81 mg by mouth daily.   Yes Historical Provider, MD  donepezil (ARICEPT) 10 MG tablet TAKE 1 TABLET BY MOUTH EVERY DAY AT BEDTIME 03/17/15  Yes Suanne Marker, MD  meloxicam (MOBIC) 7.5 MG tablet Take 7.5 mg by mouth daily. 05/28/15  Yes Historical Provider, MD   BP 200/84 mmHg  Pulse 69  Temp(Src) 97.5 F (36.4 C) (Oral)  Resp 18  SpO2 100% Physical Exam  Constitutional: She appears well-developed and well-nourished.  HENT:  Head: Normocephalic and atraumatic.  Eyes: Conjunctivae are normal. Right eye exhibits no discharge.  Neck: Neck supple.  Cardiovascular: Normal rate, regular rhythm and normal heart sounds.   No murmur heard. Pulmonary/Chest: Effort normal and breath sounds normal. She has no wheezes. She has no rales.  Abdominal: Soft. She exhibits no distension. There is no tenderness.  Musculoskeletal: Normal range of motion. She exhibits no edema.  Neurological: No cranial nerve deficit.  Mild confusion baseline.  Cranial nerves II-12 intact. She's got no posterior signs. Steady ambulation  Skin: Skin is warm and dry. No rash noted. She is not diaphoretic.  Psychiatric: Her behavior is normal.  Nursing note and vitals reviewed.   ED Course  Procedures (including critical care time) Labs Review Labs Reviewed  BASIC METABOLIC PANEL - Abnormal; Notable for the following:    Glucose, Bld 132 (*)    All other components within normal limits  CBC  URINALYSIS, ROUTINE W REFLEX MICROSCOPIC (NOT AT [facility])  CBG MONITORING, ED    Imaging Review No results found. I have personally reviewed and evaluated these  images and lab results as part of my medical decision-making.   EKG Interpretation   Date/Time:  Sunday May 31 2015 16:41:55 EST Ventricular Rate:  67 PR Interval:  185 QRS Duration: 92 QT Interval:  438 QTC Calculation: 462 R Axis:   60 Text Interpretation:  Sinus rhythm Low voltage, precordial leads no acute  ischemia. Confirmed by Kandis Mannan ([id number]) on 05/31/2015 7:33:26 PM      MDM   Final diagnoses:  None   Patient is a very pleasant 81 year old female here with her husband and daughter and daughter's husband. She had an episode of dizziness earlier today which is now resolved. Patient had her blood pressure taken at that time. This  happened at 11 AM. Patient's husband's been taking her blood pressure throughout the day and has been elevated. This is much different than her usual baseline blood pressures. We will get a CT head, look for end organ damage such as elevated creatinine, EKG changes. If all these are normal and given the patient is asymptomatically ,patient to follow up with her PCP this week.    7:40 PM Elevated troponin. EKG nonischemic. We will admit.  Would consider workup for stroke as well as workup for ischemia secondary to hypertension emergency.  Darrious Youman Randall An, MD 05/31/15 1941  Norena Bratton Randall An, MD 05/31/15 1941

## 2015-06-01 ENCOUNTER — Inpatient Hospital Stay (HOSPITAL_COMMUNITY): Payer: Commercial Managed Care - HMO

## 2015-06-01 DIAGNOSIS — R413 Other amnesia: Secondary | ICD-10-CM

## 2015-06-01 DIAGNOSIS — N390 Urinary tract infection, site not specified: Secondary | ICD-10-CM

## 2015-06-01 DIAGNOSIS — I428 Other cardiomyopathies: Secondary | ICD-10-CM

## 2015-06-01 DIAGNOSIS — Z8673 Personal history of transient ischemic attack (TIA), and cerebral infarction without residual deficits: Secondary | ICD-10-CM

## 2015-06-01 DIAGNOSIS — I160 Hypertensive urgency: Principal | ICD-10-CM

## 2015-06-01 DIAGNOSIS — F0390 Unspecified dementia without behavioral disturbance: Secondary | ICD-10-CM

## 2015-06-01 DIAGNOSIS — I674 Hypertensive encephalopathy: Secondary | ICD-10-CM

## 2015-06-01 DIAGNOSIS — I10 Essential (primary) hypertension: Secondary | ICD-10-CM

## 2015-06-01 DIAGNOSIS — R7989 Other specified abnormal findings of blood chemistry: Secondary | ICD-10-CM

## 2015-06-01 LAB — TROPONIN I
TROPONIN I: 0.51 ng/mL — AB (ref <0.031)
Troponin I: 1.26 ng/mL (ref <0.031)

## 2015-06-01 MED ORDER — LORAZEPAM 0.5 MG PO TABS
0.5000 mg | ORAL_TABLET | Freq: Once | ORAL | Status: DC
  Filled 2015-06-01: qty 1

## 2015-06-01 MED ORDER — ASPIRIN 81 MG PO CHEW
81.0000 mg | CHEWABLE_TABLET | Freq: Every day | ORAL | Status: DC
  Administered 2015-06-01 – 2015-06-02 (×2): 81 mg via ORAL
  Filled 2015-06-01 (×2): qty 1

## 2015-06-01 NOTE — Progress Notes (Signed)
Up to [facility] x2 this PM voids small amts

## 2015-06-01 NOTE — Care Management Note (Signed)
Case Management Note  Patient Details  Name: Savannah Garrison MRN: [id number] Date of Birth: 06-Apr-1930  Subjective/Objective:   81 y/o f admitted w/dizziness. Acute encephalopathy. From home. PT cons.                 Action/Plan:d/c plan home.   Expected Discharge Date:                  Expected Discharge Plan:  Home/Self Care  In-House Referral:     Discharge planning Services  CM Consult  Post Acute Care Choice:    Choice offered to:     DME Arranged:    DME Agency:     HH Arranged:    HH Agency:     Status of Service:  In process, will continue to follow  Medicare Important Message Given:    Date Medicare IM Given:    Medicare IM give by:    Date Additional Medicare IM Given:    Additional Medicare Important Message give by:     If discussed at Long Length of Stay Meetings, dates discussed:    Additional Comments:  Lanier Clam, RN 06/01/2015, 4:14 PM

## 2015-06-01 NOTE — Plan of Care (Signed)
Problem: Safety: Goal: Ability to remain free from injury will improve Outcome: Progressing Pt high fall risk, bed rails up, non-skid socks on, room near nurses station, pt call be in reach and in lowest position

## 2015-06-01 NOTE — Progress Notes (Signed)
[facility] PROGRESS NOTE  Savannah Garrison ZOX:[id number] DOB: 01-22-1930 DOA: 05/31/2015 PCP: [name], MD  Assessment/Plan: 1. Acute encephalopathy- likely hypertensive encephalopathy superimposed on underlying dementia. Called and discussed with patient's daughter, who says that at baseline patient is confused but yesterday she became more confused than usual. Blood pressure is now improved, will continue to monitor the patient in the hospital. MRI brain does not show acute stroke. 2. Hypertensive urgency- resolved, continue hydralazine when necessary 3. Elevated troponin- troponin went up from 0.44, 1.52, 1.26. Patient is not having any chest pain at this time. We'll consult cardio  for further recommendations. EKG shows no significant abnormality. Will obtain 2-D echocardiogram. 4. UTI- patient's UA was abnormal showed too numerous to count WBC, negative nitrite. Empirically started on Rocephin. Will obtain urine culture 5. Dementia- patient currently on Aricept 10 mg by mouth daily 6. DVT prophylaxis- heparin  Code Status: Full code Family Communication: Called and discussed with patient's daughter Reinaldo Meeker Disposition Plan: Home when medically stable   Consultants:  Cardiology consulted  Procedures:  None  Antibiotics:  None  HPI/Subjective: Chronic pressure wa was then 81 y.o. female who presents to the ED with c/o dizziness and HTN. Patient does not have a H/O HTN in the past. Today she had 1 min of dizziness which prompted her husband to check her BP. BP was over 200 systolic. Which prompted them to bring her to the ED. Dizziness has resolved. In the ED BP initially 219/83, came down without treatment to now 186/95. Patient reports no: chest pain, SOB, headache. She does report ongoing coccyx pain for the past 1 week but that's about it.   This morning patient continues to be confused, she is alert and oriented 1 . Denies chest pain ,  continues to have low back pain. X-ray of the sacrum/coccyx has been ordered.   Objective: Filed Vitals:   06/01/15 0900  BP:   Pulse:   Temp: 98.2 F (36.8 C)  Resp:     Intake/Output Summary (Last 24 hours) at 06/01/15 0910 Last data filed at 06/01/15 0700  Gross per 24 hour  Intake    290 ml  Output    200 ml  Net     90 ml   Filed Weights   06/01/15 0000  Weight: 52.8 kg (116 lb 6.5 oz)    Exam:   General:  Appears in no acute distress, alert , oriented to self   Cardiovascular: S1-S2 regular , no murmurs rubs or gallops   Respiratory:  clear to auscultation bilaterally, no wheezing or crackles  Abdomen:  soft, nontender, no organomegaly   Musculoskeletal:  no cyanosis/clubbing/edema of the lower extremities   Data Reviewed: Basic Metabolic Panel:  Recent Labs Lab 05/31/15 1706  NA 141  K 3.9  CL 105  CO2 29  GLUCOSE 132*  BUN 10  CREATININE 0.74  CALCIUM 9.2   Liver Function Tests: No results for input(s): AST, ALT, ALKPHOS, BILITOT, PROT, ALBUMIN in the last 168 hours. No results for input(s): LIPASE, AMYLASE in the last 168 hours. No results for input(s): AMMONIA in the last 168 hours. CBC:  Recent Labs Lab 05/31/15 1706  WBC 10.5  HGB 14.7  HCT 45.0  MCV 93.0  PLT 250   Cardiac Enzymes:  Recent Labs Lab 05/31/15 1710 05/31/15 2219 06/01/15 0130  TROPONINI 0.44* 1.52* 1.26*   BNP (last 3 results) No results for input(s): BNP in the last 8760 hours.  ProBNP (last 3  results) No results for input(s): PROBNP in the last 8760 hours.  CBG:  Recent Labs Lab 05/31/15 1649  GLUCAP 97    No results found for this or any previous visit (from the past 240 hour(s)).   Studies: Dg Chest 2 View  05/31/2015  CLINICAL DATA:  Patient with hypertension and altered mental status. EXAM: CHEST  2 VIEW COMPARISON:  Chest radiograph 03/15/2005 FINDINGS: Monitoring leads overlie the patient. Stable cardiac mediastinal contours. No  consolidative pulmonary opacities. No pleural effusion or pneumothorax. Regional skeleton is unremarkable. IMPRESSION: No acute cardiopulmonary process. Electronically Signed   By: Annia Belt M.D.   On: 05/31/2015 18:05   Ct Head Wo Contrast  05/31/2015  CLINICAL DATA:  Patient with lightheadedness and dizziness. Visual disturbances. No headache. No reported loss of consciousness. EXAM: CT HEAD WITHOUT CONTRAST TECHNIQUE: Contiguous axial images were obtained from the base of the skull through the vertex without intravenous contrast. COMPARISON:  Brain CT 07/14/2012 FINDINGS: Ventricles and sulci are appropriate for patient's age. Periventricular and subcortical white matter hypodensity compatible with chronic small vessel ischemic changes. No evidence for acute cortically based infarct, intracranial hemorrhage, mass lesion mass-effect. Paranasal sinuses unremarkable. Mastoid air cells unremarkable. Calvarium is intact. Orbits are unremarkable. IMPRESSION: No acute intracranial process. Chronic small vessel ischemic changes. Electronically Signed   By: Annia Belt M.D.   On: 05/31/2015 18:17   Mr Brain Wo Contrast  05/31/2015  CLINICAL DATA:  Lightheadedness and dizziness, visual disturbances beginning earlier today. Lost balance this morning fall without head injury. Hypertension, mild dementia. EXAM: MRI HEAD WITHOUT CONTRAST TECHNIQUE: Multiplanar, multiecho pulse sequences of the brain and surrounding structures were obtained without intravenous contrast. COMPARISON:  CT head May 31, 2015 at 1756 hours and MRI head September 23, 2011 FINDINGS: Multiple sequences are mildly or moderately motion degraded examination. The ventricles and sulci are normal for patient's age. No abnormal parenchymal signal, mass lesions, mass effect. Patchy supratentorial white matter FLAIR T2 hyperintensities. Old small cerebral peduncle infarcts. LEFT inferior basal ganglia perivascular spaces, in addition to tiny T2  hyperintensities in the bilateral basal ganglia favoring perivascular spaces and lacunar infarcts. No reduced diffusion to suggest acute ischemia. A few scattered punctate foci of susceptibility artifact most often seen with chronic hypertension though are nonspecific. No abnormal extra-axial fluid collections. No extra-axial masses though, contrast enhanced sequences would be more sensitive. Normal major intracranial vascular flow voids seen at the skull base. Ocular status post bilateral ocular lens implants. No abnormal sellar expansion. Visualized paranasal sinuses and mastoid air cells are well-aerated. No suspicious calvarial bone marrow signal. Craniocervical junction maintained. IMPRESSION: No acute intracranial process on this motion degraded examination. Stable appearance the head from 2013 including moderate chronic small vessel ischemic disease and basal ganglia perivascular spaces/ probable lacunar infarcts. Electronically Signed   By: Awilda Metro M.D.   On: 05/31/2015 22:03    Scheduled Meds: . aspirin  325 mg Oral Daily  . cefTRIAXone (ROCEPHIN)  IV  1 g Intravenous Q24H  . donepezil  10 mg Oral QHS  . heparin  5,000 Units Subcutaneous 3 times per day  . sodium chloride  3 mL Intravenous Q12H   Continuous Infusions:   Principal Problem:   Hypertensive urgency Active Problems:   Mild dementia   Memory loss   Dizziness   Elevated troponin    Time spent: 25 min    [facility]  [facility] Pager [phone]. If 7PM-7AM, please contact night-coverage at [email or web address], password [facility] 06/01/2015,  9:10 AM  LOS: 1 day

## 2015-06-01 NOTE — Consult Note (Signed)
Reason for Consult:   Elevated troponin  Requesting Physician: [facility] Primary Cardiologist New  HPI:  81 y.o. female with mild dementia, no prior history of CAD, HTN, or CHF,  presented to the ED 05/31/15 with c/o dizziness and HTN.On the day of admission she had 1 min of dizziness which prompted her husband to check her BP. BP was over 200 systolic.Which prompted them to bring her to the ED. In the ED BP initially was 219/83, this has come down to 125/61 this am with hydralazine prn. Sh denies chest pain or SOB. She had leukocytes and WBCs in her urine and ABs have been added. We are asked to see secondary to elevated Troponin- pk 1.52. EKG without acute changes, echo pending, as noted pt without chest pain or dyspnea.   PMHx:  Past Medical History  Diagnosis Date  . MCI (mild cognitive impairment)   . Vitamin D deficiency     Past Surgical History  Procedure Laterality Date  . Dilation and curettage of uterus    . Tonsillectomy and adenoidectomy      SOCHx:  reports that she has never smoked. She has never used smokeless tobacco. She reports that she drinks alcohol. She reports that she does not use illicit drugs.  FAMHx: Family History  Problem Relation Age of Onset  . Lung cancer Mother   . Alzheimer's disease Father     ALLERGIES: No Known Allergies  ROS: Review of Systems: General: negative for chills, fever, night sweats or weight changes.  Cardiovascular: negative for chest pain, dyspnea on exertion, edema, orthopnea, palpitations, paroxysmal nocturnal dyspnea or shortness of breath HEENT: negative for any visual disturbances, blindness, glaucoma Dermatological: negative for rash Respiratory: negative for cough, hemoptysis, or wheezing Urologic: negative for hematuria or dysuria Abdominal: negative for nausea, vomiting, diarrhea, bright red blood per rectum, melena, or hematemesis Neurologic: negative for visual changes, syncope, or  dizziness Musculoskeletal: negative for back pain, joint pain, or swelling Psych: cooperative and appropriate Pt has had pain in her coccyx for the past week or so All other systems reviewed and are otherwise negative except as noted above.   HOME MEDICATIONS: Prior to Admission medications   Medication Sig Start Date End Date Taking? Authorizing Provider  aspirin EC 81 MG tablet Take 81 mg by mouth daily.   Yes Historical Provider, MD  donepezil (ARICEPT) 10 MG tablet TAKE 1 TABLET BY MOUTH EVERY DAY AT BEDTIME 03/17/15  Yes Suanne Marker, MD  meloxicam (MOBIC) 7.5 MG tablet Take 7.5 mg by mouth daily. 05/28/15  Yes Historical Provider, MD    HOSPITAL MEDICATIONS: I have reviewed the patient's current medications.  VITALS: Blood pressure 125/61, pulse 79, temperature 98.2 F (36.8 C), temperature source Oral, resp. rate 20, height 5\' 5"  (1.651 m), weight 116 lb 6.5 oz (52.8 kg), SpO2 98 %.  PHYSICAL EXAM: General appearance: alert, cooperative and no distress Neck: no carotid bruit and no JVD Lungs: clear to auscultation bilaterally Heart: regular rate and rhythm Abdomen: soft, non-tender; bowel sounds normal; no masses,  no organomegaly Extremities: extremities normal, atraumatic, no cyanosis or edema Pulses: 2+ and symmetric Skin: Skin color, texture, turgor normal. No rashes or lesions Neurologic: Grossly normal  LABS: Results for orders placed or performed during the hospital encounter of 05/31/15 (from the past 24 hour(s))  CBG monitoring, ED     Status: None   Collection Time: 05/31/15  4:49 PM  Result Value Ref Range  Glucose-Capillary 97 65 - 99 mg/dL  Basic metabolic panel     Status: Abnormal   Collection Time: 05/31/15  5:06 PM  Result Value Ref Range   Sodium 141 135 - 145 mmol/L   Potassium 3.9 3.5 - 5.1 mmol/L   Chloride 105 101 - 111 mmol/L   CO2 29 22 - 32 mmol/L   Glucose, Bld 132 (H) 65 - 99 mg/dL   BUN 10 6 - 20 mg/dL   Creatinine, Ser 4.78 0.44  - 1.00 mg/dL   Calcium 9.2 8.9 - 29.5 mg/dL   GFR calc non Af Amer >60 >60 mL/min   GFR calc Af Amer >60 >60 mL/min   Anion gap 7 5 - 15  CBC     Status: None   Collection Time: 05/31/15  5:06 PM  Result Value Ref Range   WBC 10.5 4.0 - 10.5 K/uL   RBC 4.84 3.87 - 5.11 MIL/uL   Hemoglobin 14.7 12.0 - 15.0 g/dL   HCT 62.1 30.8 - 65.7 %   MCV 93.0 78.0 - 100.0 fL   MCH 30.4 26.0 - 34.0 pg   MCHC 32.7 30.0 - 36.0 g/dL   RDW 84.6 96.2 - 95.2 %   Platelets 250 150 - 400 K/uL  Troponin I     Status: Abnormal   Collection Time: 05/31/15  5:10 PM  Result Value Ref Range   Troponin I 0.44 (H) <0.031 ng/mL  Urinalysis, Routine w reflex microscopic (not at [facility])     Status: Abnormal   Collection Time: 05/31/15  6:30 PM  Result Value Ref Range   Color, Urine YELLOW YELLOW   APPearance TURBID (A) CLEAR   Specific Gravity, Urine 1.018 1.005 - 1.030   pH 6.5 5.0 - 8.0   Glucose, UA NEGATIVE NEGATIVE mg/dL   Hgb urine dipstick SMALL (A) NEGATIVE   Bilirubin Urine NEGATIVE NEGATIVE   Ketones, ur 15 (A) NEGATIVE mg/dL   Protein, ur 30 (A) NEGATIVE mg/dL   Urobilinogen, UA 1.0 0.0 - 1.0 mg/dL   Nitrite NEGATIVE NEGATIVE   Leukocytes, UA MODERATE (A) NEGATIVE  Urine microscopic-add on     Status: Abnormal   Collection Time: 05/31/15  6:30 PM  Result Value Ref Range   Squamous Epithelial / LPF RARE RARE   WBC, UA TOO NUMEROUS TO COUNT <3 WBC/hpf   RBC / HPF 0-2 <3 RBC/hpf   Bacteria, UA MANY (A) RARE   Crystals CA OXALATE CRYSTALS (A) NEGATIVE  Troponin I (q 6hr x 3)     Status: Abnormal   Collection Time: 05/31/15 10:19 PM  Result Value Ref Range   Troponin I 1.52 (HH) <0.031 ng/mL  Troponin I (q 6hr x 3)     Status: Abnormal   Collection Time: 06/01/15  1:30 AM  Result Value Ref Range   Troponin I 1.26 (HH) <0.031 ng/mL    EKG: NSR, NSST changes  IMAGING: Dg Chest 2 View  05/31/2015  CLINICAL DATA:  Patient with hypertension and altered mental status. EXAM: CHEST  2 VIEW  COMPARISON:  Chest radiograph 03/15/2005 FINDINGS: Monitoring leads overlie the patient. Stable cardiac mediastinal contours. No consolidative pulmonary opacities. No pleural effusion or pneumothorax. Regional skeleton is unremarkable. IMPRESSION: No acute cardiopulmonary process. Electronically Signed   By: Annia Belt M.D.   On: 05/31/2015 18:05     Mr Brain Wo Contrast  05/31/2015  CLINICAL DATA:  Lightheadedness and dizziness, visual disturbances beginning earlier today. Lost balance this morning fall without head  injury. Hypertension, mild dementia. EXAM: MRI HEAD WITHOUT CONTRAST IMPRESSION: No acute intracranial process on this motion degraded examination. Stable appearance the head from 2013 including moderate chronic small vessel ischemic disease and basal ganglia perivascular spaces/ probable lacunar infarcts. Electronically Signed   By: Awilda Metro M.D.   On: 05/31/2015 22:03    IMPRESSION: Principal Problem:   Hypertensive urgency Active Problems:   Elevated troponin   Dizziness   Mild dementia   Memory loss   History of CVA (cerebrovascular accident)   RECOMMENDATION: Echo ordered, further work up pending results. Decrease ASA to 81 mg. Check Lipids, LFTs.  She is not hypertensive now, HR 75, and no chest pain on no cardiac medications.   Time Spent Directly with Patient: 40 minutes  Abelino Derrick, [place] [phone] beeper 06/01/2015, 9:24 AM    Agree with note written by [facility]  Pt admitted with increased BP, currently nl. No prior H/O HTN. Trop mildly elevated but no CP or H/O CAD. Exam benign. EXG w/o acute changes. Pt being Rx for UTI. 2 D pending. Ig nl and no SWMA would not recommend further cardiac w/u.   [name] 06/01/2015 11:25 AM

## 2015-06-01 NOTE — Progress Notes (Signed)
  Echocardiogram 2D Echocardiogram has been performed.  Nolon Rod 06/01/2015, 1:46 PM

## 2015-06-02 DIAGNOSIS — R42 Dizziness and giddiness: Secondary | ICD-10-CM

## 2015-06-02 LAB — LIPID PANEL
Cholesterol: 185 mg/dL (ref 0–200)
HDL: 72 mg/dL (ref >40)
LDL Cholesterol: 96 mg/dL (ref 0–99)
Total CHOL/HDL Ratio: 2.6 RATIO
Triglycerides: 86 mg/dL (ref <150)
VLDL: 17 mg/dL (ref 0–40)

## 2015-06-02 LAB — COMPREHENSIVE METABOLIC PANEL
ALT: 13 U/L — ABNORMAL LOW (ref 14–54)
AST: 27 U/L (ref 15–41)
Albumin: 3.4 g/dL — ABNORMAL LOW (ref 3.5–5.0)
Alkaline Phosphatase: 73 U/L (ref 38–126)
Anion gap: 9 (ref 5–15)
BUN: 17 mg/dL (ref 6–20)
CO2: 26 mmol/L (ref 22–32)
Calcium: 8.9 mg/dL (ref 8.9–10.3)
Chloride: 106 mmol/L (ref 101–111)
Creatinine, Ser: 0.69 mg/dL (ref 0.44–1.00)
GFR calc Af Amer: >60 mL/min (ref >60)
GFR calc non Af Amer: >60 mL/min (ref >60)
Glucose, Bld: 103 mg/dL — ABNORMAL HIGH (ref 65–99)
Potassium: 3.8 mmol/L (ref 3.5–5.1)
Sodium: 141 mmol/L (ref 135–145)
Total Bilirubin: 0.8 mg/dL (ref 0.3–1.2)
Total Protein: 6.1 g/dL — ABNORMAL LOW (ref 6.5–8.1)

## 2015-06-02 MED ORDER — CEPHALEXIN 500 MG PO CAPS: 500.0000 mg | ORAL_CAPSULE | Freq: Two times a day (BID) | ORAL | Status: DC

## 2015-06-02 MED ORDER — HYDROCHLOROTHIAZIDE 12.5 MG PO CAPS
12.5000 mg | ORAL_CAPSULE | Freq: Every day | ORAL | Status: DC
  Administered 2015-06-02: 12.5 mg via ORAL
  Filled 2015-06-02 (×2): qty 1

## 2015-06-02 MED ORDER — LISINOPRIL 10 MG PO TABS: 10.0000 mg | ORAL_TABLET | Freq: Every day | ORAL | Status: DC

## 2015-06-02 MED ORDER — HYDROCHLOROTHIAZIDE 12.5 MG PO CAPS: 12.5000 mg | ORAL_CAPSULE | Freq: Every day | ORAL | Status: DC

## 2015-06-02 MED ORDER — LISINOPRIL 10 MG PO TABS
10.0000 mg | ORAL_TABLET | Freq: Every day | ORAL | Status: DC
  Administered 2015-06-02: 10 mg via ORAL
  Filled 2015-06-02 (×2): qty 1

## 2015-06-02 NOTE — Evaluation (Signed)
Physical Therapy Evaluation Patient Details Name: Savannah Garrison MRN: [id number] DOB: 19-Apr-1930 Today's Date: 06/02/2015   History of Present Illness  81 yo female admitted with hypertensive urgency, dizziness. Hx of CVA, dementia.   Clinical Impression  On eval, pt required Min assist for mobility-walked ~75 feet with RW. Pt tolerated distance well. Family present. Husband states he is with pt "90% of time". D/c plan is for home.     Follow Up Recommendations Home health PT;Supervision/Assistance - 24 hour    Equipment Recommendations  None recommended by PT    Recommendations for Other Services       Precautions / Restrictions Precautions Precautions: Fall Restrictions Weight Bearing Restrictions: No      Mobility  Bed Mobility               General bed mobility comments: pt oob in recliner  Transfers Overall transfer level: Needs assistance Equipment used: Rolling walker (2 wheeled) Transfers: Sit to/from Stand Sit to Stand: Min assist         General transfer comment: small amount of assist to rise, stabilize, control descent  Ambulation/Gait Ambulation/Gait assistance: Min assist Ambulation Distance (Feet): 75 Feet Assistive device: Rolling walker (2 wheeled) Gait Pattern/deviations: Step-through pattern;Decreased stride length;Trunk flexed     General Gait Details: intermittent assist to stabilize.   Stairs            Wheelchair Mobility    Modified Rankin (Stroke Patients Only)       Balance Overall balance assessment: Needs assistance         Standing balance support: Bilateral upper extremity supported;During functional activity Standing balance-Leahy Scale: Poor                               Pertinent Vitals/Pain Pain Assessment: Faces Faces Pain Scale: Hurts little more Pain Location: generalized pain Pain Descriptors / Indicators: Sore Pain Intervention(s): Monitored during session    Home Living  Family/patient expects to be discharged to:: Private residence Living Arrangements: Spouse/significant other Available Help at Discharge: Family Type of Home: House Home Access: Stairs to enter Entrance Stairs-Rails: Right Entrance Stairs-Number of Steps: 3 Home Layout: One level Home Equipment: Environmental consultant - 4 wheels      Prior Function Level of Independence: Independent with assistive device(s)               Hand Dominance        Extremity/Trunk Assessment   Upper Extremity Assessment: Generalized weakness           Lower Extremity Assessment: Generalized weakness      Cervical / Trunk Assessment: Kyphotic  Communication   Communication: No difficulties  Cognition Arousal/Alertness: Awake/alert Behavior During Therapy: WFL for tasks assessed/performed Overall Cognitive Status: History of cognitive impairments - at baseline                      General Comments      Exercises        Assessment/Plan    PT Assessment Patient needs continued PT services;All further PT needs can be met in the next venue of care  PT Diagnosis Difficulty walking;Generalized weakness   PT Problem List Decreased strength;Decreased balance;Decreased mobility;Decreased cognition  PT Treatment Interventions Gait training;Functional mobility training;Balance training   PT Goals (Current goals can be found in the Care Plan section) Acute Rehab PT Goals Patient Stated Goal: home. PT Goal Formulation: All assessment and  education complete, DC therapy    Frequency     Barriers to discharge        Co-evaluation               End of Session Equipment Utilized During Treatment: Gait belt Activity Tolerance: Patient tolerated treatment well Patient left: in chair;with chair alarm set;with family/visitor present           Time: 1208-1217 PT Time Calculation (min) (ACUTE ONLY): 9 min   Charges:   PT Evaluation $Initial PT Evaluation Tier I: 1 Procedure      PT G Codes:        Weston Anna, MPT Pager: [phone]

## 2015-06-02 NOTE — Care Management Note (Signed)
Case Management Note  Patient Details  Name: TYRAN NIX MRN: [id number] Date of Birth: [birth year]  Subjective/Objective:    PT recc HH. [facility] chosen by dtr for [facility].TC [facility] rep Farrah-aware of referral, orders, & d/c.Patient has spouse @ home who helps w/ADL's, meds, & pcp appt.                Action/Plan:d/c home w/HHC.   Expected Discharge Date:                  Expected Discharge Plan:  Home w Home Health Services  In-House Referral:     Discharge planning Services  CM Consult  Post Acute Care Choice:    Choice offered to:  Patient  DME Arranged:    DME Agency:     HH Arranged:  PT HH Agency:  [facility]  Status of Service:  Completed, signed off  Medicare Important Message Given:    Date Medicare IM Given:    Medicare IM give by:    Date Additional Medicare IM Given:    Additional Medicare Important Message give by:     If discussed at Long Length of Stay Meetings, dates discussed:    Additional Comments:  Lanier Clam, RN 06/02/2015, 12:44 PM

## 2015-06-02 NOTE — Progress Notes (Signed)
   Patient is stable for discharge from cardiology perspective. Can follow-up as needed with Dr. Allyson Sabal.   Signed, Ellsworth Lennox, PA-C 06/02/2015, 11:31 AM Pager: [phone]

## 2015-06-02 NOTE — Discharge Summary (Addendum)
Physician Discharge Summary  Savannah Garrison QQV:[id number] DOB: 07/04/1930 DOA: 05/31/2015  PCP: Savannah Alken, MD  Admit date: 05/31/2015 Discharge date: 06/02/2015  Time spent: 25* minutes  Recommendations for Outpatient Follow-up:  1. Follow up PCP in 2 weeks  Discharge Diagnoses:  Principal Problem:   Hypertensive urgency Active Problems:   Mild dementia   Memory loss   Dizziness   Elevated troponin   History of CVA (cerebrovascular accident)   UTI (urinary tract infection)   Encephalopathy, hypertensive   Discharge Condition: Stable  Diet recommendation: Low salt diet  Filed Weights   06/01/15 0000  Weight: 52.8 kg (116 lb 6.5 oz)    History of present illness:  81 y.o. female who presents to the ED with c/o dizziness and HTN. Patient does not have a H/O HTN in the past. Today she had 1 min of dizziness which prompted her husband to check her BP. BP was over 200 systolic. Which prompted them to bring her to the ED. Dizziness has resolved. In the ED BP initially 219/83, came down without treatment to now 186/95. Patient reports no: chest pain, SOB, headache. She does report ongoing coccyx pain for the past 1 week but that's about it.    Hospital Course:  1. Acute encephalopathy- resolved,  likely hypertensive encephalopathy superimposed on underlying dementia. Called and discussed with patient's daughter, who says that at baseline patient is confused but yesterday she became more confused than usual. Blood pressure is now improved. MRI brain does not show acute stroke. Patient now back to baseline. 2. Hypertensive urgency- resolved, started Lisinopril 10 mg po daily. 3. Elevated troponin- troponin went up from 0.44, 1.52, 1.26. Patient is not having any chest pain at this time. We'll consult cardio for further recommendations. EKG shows no significant abnormality. 2-D echocardiogram showed grade 1 diastolic dysfunction, will start HCTZ 12.5 mg po  daily. 4. Hypertension- start Lisinopril 10 mg , HCTZ 12.5 mg po daily. 5. UTI- patient's UA was abnormal showed too numerous to count WBC, negative nitrite. Empirically started on Rocephin. No urine culture was obtained, will send her home on Po Keflex 500 mg bid x 5 more days. 6. Back pain- chronic, xray sacrum/coccyx is negative for fracture. Continue Meloxicam prn. 7. Dementia- patient currently on Aricept 10 mg by mouth daily  Procedures:  Echo   Consultations:  Cardiology   Discharge Exam: Filed Vitals:   06/02/15 0531  BP: 160/69  Pulse: 57  Temp: 98.6 F (37 C)  Resp: 18    General: Appears in no acute distress Cardiovascular: S1S2 RRR Respiratory: Clear bilaterally  Discharge Instructions   Discharge Instructions    Diet - low sodium heart healthy    Complete by:  As directed      Increase activity slowly    Complete by:  As directed           Current Discharge Medication List    START taking these medications   Details  cephALEXin (KEFLEX) 500 MG capsule Take 1 capsule (500 mg total) by mouth 2 (two) times daily. Qty: 10 capsule, Refills: 0    lisinopril (ZESTRIL) 10 MG tablet Take 1 tablet (10 mg total) by mouth daily. Qty: 30 tablet, Refills: 0      CONTINUE these medications which have NOT CHANGED   Details  aspirin EC 81 MG tablet Take 81 mg by mouth daily.    donepezil (ARICEPT) 10 MG tablet TAKE 1 TABLET BY MOUTH EVERY DAY AT BEDTIME Qty: 90  tablet, Refills: 0    meloxicam (MOBIC) 7.5 MG tablet Take 7.5 mg by mouth daily.       No Known Allergies Follow-up Information    Follow up with Savannah Batty, MD.   Specialties:  Cardiology, Radiology   Why:  Follow-up as needed.   Contact information:   [address] [place] [address] [phone]        The results of significant diagnostics from this hospitalization (including imaging, microbiology, ancillary and laboratory) are listed below for reference.     Significant Diagnostic Studies: Dg Chest 2 View  05/31/2015  CLINICAL DATA:  Patient with hypertension and altered mental status. EXAM: CHEST  2 VIEW COMPARISON:  Chest radiograph 03/15/2005 FINDINGS: Monitoring leads overlie the patient. Stable cardiac mediastinal contours. No consolidative pulmonary opacities. No pleural effusion or pneumothorax. Regional skeleton is unremarkable. IMPRESSION: No acute cardiopulmonary process. Electronically Signed   By: Annia Belt M.D.   On: 05/31/2015 18:05   Dg Sacrum/coccyx  06/01/2015  CLINICAL DATA:  Patient admitted through the emergency room yesterday for dizziness. Now complaining of tailbone region pain. No specific injury. EXAM: SACRUM AND COCCYX - 2+ VIEW COMPARISON:  None. FINDINGS: No fracture. No bone lesion. Bones are demineralized. The SI joints and right hip joint are normally spaced and aligned. Soft tissues are unremarkable. IMPRESSION: No fracture or bone lesion. Electronically Signed   By: Amie Portland M.D.   On: 06/01/2015 12:15   Ct Head Wo Contrast  05/31/2015  CLINICAL DATA:  Patient with lightheadedness and dizziness. Visual disturbances. No headache. No reported loss of consciousness. EXAM: CT HEAD WITHOUT CONTRAST TECHNIQUE: Contiguous axial images were obtained from the base of the skull through the vertex without intravenous contrast. COMPARISON:  Brain CT 07/14/2012 FINDINGS: Ventricles and sulci are appropriate for patient's age. Periventricular and subcortical white matter hypodensity compatible with chronic small vessel ischemic changes. No evidence for acute cortically based infarct, intracranial hemorrhage, mass lesion mass-effect. Paranasal sinuses unremarkable. Mastoid air cells unremarkable. Calvarium is intact. Orbits are unremarkable. IMPRESSION: No acute intracranial process. Chronic small vessel ischemic changes. Electronically Signed   By: Annia Belt M.D.   On: 05/31/2015 18:17   Mr Brain Wo Contrast  05/31/2015   CLINICAL DATA:  Lightheadedness and dizziness, visual disturbances beginning earlier today. Lost balance this morning fall without head injury. Hypertension, mild dementia. EXAM: MRI HEAD WITHOUT CONTRAST TECHNIQUE: Multiplanar, multiecho pulse sequences of the brain and surrounding structures were obtained without intravenous contrast. COMPARISON:  CT head May 31, 2015 at 1756 hours and MRI head September 23, 2011 FINDINGS: Multiple sequences are mildly or moderately motion degraded examination. The ventricles and sulci are normal for patient's age. No abnormal parenchymal signal, mass lesions, mass effect. Patchy supratentorial white matter FLAIR T2 hyperintensities. Old small cerebral peduncle infarcts. LEFT inferior basal ganglia perivascular spaces, in addition to tiny T2 hyperintensities in the bilateral basal ganglia favoring perivascular spaces and lacunar infarcts. No reduced diffusion to suggest acute ischemia. A few scattered punctate foci of susceptibility artifact most often seen with chronic hypertension though are nonspecific. No abnormal extra-axial fluid collections. No extra-axial masses though, contrast enhanced sequences would be more sensitive. Normal major intracranial vascular flow voids seen at the skull base. Ocular status post bilateral ocular lens implants. No abnormal sellar expansion. Visualized paranasal sinuses and mastoid air cells are well-aerated. No suspicious calvarial bone marrow signal. Craniocervical junction maintained. IMPRESSION: No acute intracranial process on this motion degraded examination. Stable appearance the head  from 2013 including moderate chronic small vessel ischemic disease and basal ganglia perivascular spaces/ probable lacunar infarcts. Electronically Signed   By: Awilda Metro M.D.   On: 05/31/2015 22:03    Microbiology: No results found for this or any previous visit (from the past 240 hour(s)).   Labs: Basic Metabolic Panel:  Recent Labs Lab  05/31/15 1706 06/02/15 0435  NA 141 141  K 3.9 3.8  CL 105 106  CO2 29 26  GLUCOSE 132* 103*  BUN 10 17  CREATININE 0.74 0.69  CALCIUM 9.2 8.9   Liver Function Tests:  Recent Labs Lab 06/02/15 0435  AST 27  ALT 13*  ALKPHOS 73  BILITOT 0.8  PROT 6.1*  ALBUMIN 3.4*   No results for input(s): LIPASE, AMYLASE in the last 168 hours. No results for input(s): AMMONIA in the last 168 hours. CBC:  Recent Labs Lab 05/31/15 1706  WBC 10.5  HGB 14.7  HCT 45.0  MCV 93.0  PLT 250   Cardiac Enzymes:  Recent Labs Lab 05/31/15 1710 05/31/15 2219 06/01/15 0130 06/01/15 0827  TROPONINI 0.44* 1.52* 1.26* 0.51*   BNP: BNP (last 3 results) No results for input(s): BNP in the last 8760 hours.  ProBNP (last 3 results) No results for input(s): PROBNP in the last 8760 hours.  CBG:  Recent Labs Lab 05/31/15 1649  GLUCAP 97       Signed:  Haylee Mcanany S  [facility] 06/02/2015, 11:57 AM

## 2015-06-02 NOTE — Progress Notes (Signed)
Pt became confused and agitated. Pt removed tele. Order received for 0.5 PO ativan. Pt refused to take ativan or any other medications. Will continue to monitor pt.

## 2015-06-03 DIAGNOSIS — Z9181 History of falling: Secondary | ICD-10-CM | POA: Diagnosis not present

## 2015-06-03 DIAGNOSIS — F0390 Unspecified dementia without behavioral disturbance: Secondary | ICD-10-CM | POA: Diagnosis not present

## 2015-06-03 DIAGNOSIS — I10 Essential (primary) hypertension: Secondary | ICD-10-CM | POA: Diagnosis not present

## 2015-06-03 DIAGNOSIS — Z8673 Personal history of transient ischemic attack (TIA), and cerebral infarction without residual deficits: Secondary | ICD-10-CM | POA: Diagnosis not present

## 2015-06-03 DIAGNOSIS — M6281 Muscle weakness (generalized): Secondary | ICD-10-CM | POA: Diagnosis not present

## 2015-06-03 DIAGNOSIS — N390 Urinary tract infection, site not specified: Secondary | ICD-10-CM | POA: Diagnosis not present

## 2015-06-09 DIAGNOSIS — I10 Essential (primary) hypertension: Secondary | ICD-10-CM | POA: Diagnosis not present

## 2015-06-09 DIAGNOSIS — N390 Urinary tract infection, site not specified: Secondary | ICD-10-CM | POA: Diagnosis not present

## 2015-06-09 DIAGNOSIS — M6281 Muscle weakness (generalized): Secondary | ICD-10-CM | POA: Diagnosis not present

## 2015-06-09 DIAGNOSIS — Z9181 History of falling: Secondary | ICD-10-CM | POA: Diagnosis not present

## 2015-06-09 DIAGNOSIS — Z8673 Personal history of transient ischemic attack (TIA), and cerebral infarction without residual deficits: Secondary | ICD-10-CM | POA: Diagnosis not present

## 2015-06-09 DIAGNOSIS — F0390 Unspecified dementia without behavioral disturbance: Secondary | ICD-10-CM | POA: Diagnosis not present

## 2015-06-12 DIAGNOSIS — F0390 Unspecified dementia without behavioral disturbance: Secondary | ICD-10-CM | POA: Diagnosis not present

## 2015-06-12 DIAGNOSIS — I10 Essential (primary) hypertension: Secondary | ICD-10-CM | POA: Diagnosis not present

## 2015-06-12 DIAGNOSIS — Z8673 Personal history of transient ischemic attack (TIA), and cerebral infarction without residual deficits: Secondary | ICD-10-CM | POA: Diagnosis not present

## 2015-06-12 DIAGNOSIS — Z9181 History of falling: Secondary | ICD-10-CM | POA: Diagnosis not present

## 2015-06-12 DIAGNOSIS — M6281 Muscle weakness (generalized): Secondary | ICD-10-CM | POA: Diagnosis not present

## 2015-06-12 DIAGNOSIS — N390 Urinary tract infection, site not specified: Secondary | ICD-10-CM | POA: Diagnosis not present

## 2015-06-15 DIAGNOSIS — Z8673 Personal history of transient ischemic attack (TIA), and cerebral infarction without residual deficits: Secondary | ICD-10-CM | POA: Diagnosis not present

## 2015-06-15 DIAGNOSIS — M6281 Muscle weakness (generalized): Secondary | ICD-10-CM | POA: Diagnosis not present

## 2015-06-15 DIAGNOSIS — F0390 Unspecified dementia without behavioral disturbance: Secondary | ICD-10-CM | POA: Diagnosis not present

## 2015-06-15 DIAGNOSIS — I10 Essential (primary) hypertension: Secondary | ICD-10-CM | POA: Diagnosis not present

## 2015-06-15 DIAGNOSIS — Z9181 History of falling: Secondary | ICD-10-CM | POA: Diagnosis not present

## 2015-06-15 DIAGNOSIS — N390 Urinary tract infection, site not specified: Secondary | ICD-10-CM | POA: Diagnosis not present

## 2015-06-16 DIAGNOSIS — I10 Essential (primary) hypertension: Secondary | ICD-10-CM | POA: Diagnosis not present

## 2015-06-16 DIAGNOSIS — Z8669 Personal history of other diseases of the nervous system and sense organs: Secondary | ICD-10-CM | POA: Diagnosis not present

## 2015-06-16 DIAGNOSIS — Z8679 Personal history of other diseases of the circulatory system: Secondary | ICD-10-CM | POA: Diagnosis not present

## 2015-06-16 DIAGNOSIS — R413 Other amnesia: Secondary | ICD-10-CM | POA: Diagnosis not present

## 2015-06-17 DIAGNOSIS — N390 Urinary tract infection, site not specified: Secondary | ICD-10-CM | POA: Diagnosis not present

## 2015-06-17 DIAGNOSIS — Z9181 History of falling: Secondary | ICD-10-CM | POA: Diagnosis not present

## 2015-06-17 DIAGNOSIS — Z8673 Personal history of transient ischemic attack (TIA), and cerebral infarction without residual deficits: Secondary | ICD-10-CM | POA: Diagnosis not present

## 2015-06-17 DIAGNOSIS — M6281 Muscle weakness (generalized): Secondary | ICD-10-CM | POA: Diagnosis not present

## 2015-06-17 DIAGNOSIS — I10 Essential (primary) hypertension: Secondary | ICD-10-CM | POA: Diagnosis not present

## 2015-06-17 DIAGNOSIS — F0390 Unspecified dementia without behavioral disturbance: Secondary | ICD-10-CM | POA: Diagnosis not present

## 2015-06-22 DIAGNOSIS — M6281 Muscle weakness (generalized): Secondary | ICD-10-CM | POA: Diagnosis not present

## 2015-06-22 DIAGNOSIS — Z8673 Personal history of transient ischemic attack (TIA), and cerebral infarction without residual deficits: Secondary | ICD-10-CM | POA: Diagnosis not present

## 2015-06-22 DIAGNOSIS — N390 Urinary tract infection, site not specified: Secondary | ICD-10-CM | POA: Diagnosis not present

## 2015-06-22 DIAGNOSIS — Z9181 History of falling: Secondary | ICD-10-CM | POA: Diagnosis not present

## 2015-06-22 DIAGNOSIS — I10 Essential (primary) hypertension: Secondary | ICD-10-CM | POA: Diagnosis not present

## 2015-06-22 DIAGNOSIS — F0390 Unspecified dementia without behavioral disturbance: Secondary | ICD-10-CM | POA: Diagnosis not present

## 2015-06-24 ENCOUNTER — Other Ambulatory Visit: Payer: Self-pay

## 2015-06-24 DIAGNOSIS — I10 Essential (primary) hypertension: Secondary | ICD-10-CM | POA: Diagnosis not present

## 2015-06-24 DIAGNOSIS — Z8673 Personal history of transient ischemic attack (TIA), and cerebral infarction without residual deficits: Secondary | ICD-10-CM | POA: Diagnosis not present

## 2015-06-24 DIAGNOSIS — Z9181 History of falling: Secondary | ICD-10-CM | POA: Diagnosis not present

## 2015-06-24 DIAGNOSIS — F0390 Unspecified dementia without behavioral disturbance: Secondary | ICD-10-CM | POA: Diagnosis not present

## 2015-06-24 DIAGNOSIS — N390 Urinary tract infection, site not specified: Secondary | ICD-10-CM | POA: Diagnosis not present

## 2015-06-24 DIAGNOSIS — M6281 Muscle weakness (generalized): Secondary | ICD-10-CM | POA: Diagnosis not present

## 2015-06-24 MED ORDER — DONEPEZIL HCL 10 MG PO TABS: 10.0000 mg | ORAL_TABLET | Freq: Every day | ORAL | Status: DC

## 2015-06-29 DIAGNOSIS — N390 Urinary tract infection, site not specified: Secondary | ICD-10-CM | POA: Diagnosis not present

## 2015-06-29 DIAGNOSIS — Z8673 Personal history of transient ischemic attack (TIA), and cerebral infarction without residual deficits: Secondary | ICD-10-CM | POA: Diagnosis not present

## 2015-06-29 DIAGNOSIS — I10 Essential (primary) hypertension: Secondary | ICD-10-CM | POA: Diagnosis not present

## 2015-06-29 DIAGNOSIS — M6281 Muscle weakness (generalized): Secondary | ICD-10-CM | POA: Diagnosis not present

## 2015-06-29 DIAGNOSIS — Z9181 History of falling: Secondary | ICD-10-CM | POA: Diagnosis not present

## 2015-06-29 DIAGNOSIS — F0390 Unspecified dementia without behavioral disturbance: Secondary | ICD-10-CM | POA: Diagnosis not present

## 2015-06-30 DIAGNOSIS — R413 Other amnesia: Secondary | ICD-10-CM | POA: Diagnosis not present

## 2015-06-30 DIAGNOSIS — I10 Essential (primary) hypertension: Secondary | ICD-10-CM | POA: Diagnosis not present

## 2015-06-30 DIAGNOSIS — Z79899 Other long term (current) drug therapy: Secondary | ICD-10-CM | POA: Diagnosis not present

## 2015-07-01 DIAGNOSIS — Z8673 Personal history of transient ischemic attack (TIA), and cerebral infarction without residual deficits: Secondary | ICD-10-CM | POA: Diagnosis not present

## 2015-07-01 DIAGNOSIS — I10 Essential (primary) hypertension: Secondary | ICD-10-CM | POA: Diagnosis not present

## 2015-07-01 DIAGNOSIS — Z9181 History of falling: Secondary | ICD-10-CM | POA: Diagnosis not present

## 2015-07-01 DIAGNOSIS — M6281 Muscle weakness (generalized): Secondary | ICD-10-CM | POA: Diagnosis not present

## 2015-07-01 DIAGNOSIS — F0390 Unspecified dementia without behavioral disturbance: Secondary | ICD-10-CM | POA: Diagnosis not present

## 2015-07-01 DIAGNOSIS — N390 Urinary tract infection, site not specified: Secondary | ICD-10-CM | POA: Diagnosis not present

## 2015-09-04 DIAGNOSIS — R296 Repeated falls: Secondary | ICD-10-CM | POA: Diagnosis not present

## 2015-09-04 DIAGNOSIS — Z7409 Other reduced mobility: Secondary | ICD-10-CM | POA: Diagnosis not present

## 2015-09-04 DIAGNOSIS — R413 Other amnesia: Secondary | ICD-10-CM | POA: Diagnosis not present

## 2016-01-29 DIAGNOSIS — R451 Restlessness and agitation: Secondary | ICD-10-CM | POA: Diagnosis not present

## 2016-02-04 DIAGNOSIS — N390 Urinary tract infection, site not specified: Secondary | ICD-10-CM | POA: Diagnosis not present

## 2016-03-07 DIAGNOSIS — Z23 Encounter for immunization: Secondary | ICD-10-CM | POA: Diagnosis not present

## 2016-03-07 DIAGNOSIS — R413 Other amnesia: Secondary | ICD-10-CM | POA: Diagnosis not present

## 2016-03-07 DIAGNOSIS — R451 Restlessness and agitation: Secondary | ICD-10-CM | POA: Diagnosis not present

## 2016-05-26 DIAGNOSIS — E538 Deficiency of other specified B group vitamins: Secondary | ICD-10-CM | POA: Diagnosis not present

## 2016-05-26 DIAGNOSIS — R413 Other amnesia: Secondary | ICD-10-CM | POA: Diagnosis not present

## 2016-05-26 DIAGNOSIS — R4182 Altered mental status, unspecified: Secondary | ICD-10-CM | POA: Diagnosis not present

## 2016-05-26 DIAGNOSIS — N390 Urinary tract infection, site not specified: Secondary | ICD-10-CM | POA: Diagnosis not present

## 2016-05-26 DIAGNOSIS — R634 Abnormal weight loss: Secondary | ICD-10-CM | POA: Diagnosis not present

## 2016-08-02 ENCOUNTER — Emergency Department (HOSPITAL_COMMUNITY): Payer: Medicare HMO

## 2016-08-02 ENCOUNTER — Emergency Department (HOSPITAL_COMMUNITY)
Admission: EM | Admit: 2016-08-02 | Discharge: 2016-08-02 | Disposition: A | Discharge status: Home / Self Care | Payer: Medicare HMO | Attending: Emergency Medicine | Admitting: Emergency Medicine

## 2016-08-02 ENCOUNTER — Encounter (HOSPITAL_COMMUNITY): Payer: Self-pay

## 2016-08-02 DIAGNOSIS — G309 Alzheimer's disease, unspecified: Secondary | ICD-10-CM | POA: Insufficient documentation

## 2016-08-02 DIAGNOSIS — R531 Weakness: Secondary | ICD-10-CM | POA: Diagnosis not present

## 2016-08-02 DIAGNOSIS — R402411 Glasgow coma scale score 13-15, in the field [EMT or ambulance]: Secondary | ICD-10-CM | POA: Diagnosis not present

## 2016-08-02 DIAGNOSIS — I6789 Other cerebrovascular disease: Secondary | ICD-10-CM | POA: Diagnosis not present

## 2016-08-02 DIAGNOSIS — Z7982 Long term (current) use of aspirin: Secondary | ICD-10-CM | POA: Insufficient documentation

## 2016-08-02 DIAGNOSIS — Z79899 Other long term (current) drug therapy: Secondary | ICD-10-CM | POA: Insufficient documentation

## 2016-08-02 DIAGNOSIS — M546 Pain in thoracic spine: Secondary | ICD-10-CM | POA: Diagnosis not present

## 2016-08-02 DIAGNOSIS — F4489 Other dissociative and conversion disorders: Secondary | ICD-10-CM | POA: Diagnosis not present

## 2016-08-02 DIAGNOSIS — M545 Low back pain: Secondary | ICD-10-CM | POA: Insufficient documentation

## 2016-08-02 DIAGNOSIS — Z8673 Personal history of transient ischemic attack (TIA), and cerebral infarction without residual deficits: Secondary | ICD-10-CM | POA: Insufficient documentation

## 2016-08-02 HISTORY — DX: Alzheimer's disease, unspecified: G30.9

## 2016-08-02 HISTORY — DX: Dementia in other diseases classified elsewhere, unspecified severity, without behavioral disturbance, psychotic disturbance, mood disturbance, and anxiety: F02.80

## 2016-08-02 LAB — URINALYSIS, ROUTINE W REFLEX MICROSCOPIC
BILIRUBIN URINE: NEGATIVE
Glucose, UA: NEGATIVE mg/dL
HGB URINE DIPSTICK: NEGATIVE
Ketones, ur: 5 mg/dL — AB
NITRITE: NEGATIVE
PH: 6 (ref 5.0–8.0)
Protein, ur: NEGATIVE mg/dL
Specific Gravity, Urine: 1.016 (ref 1.005–1.030)

## 2016-08-02 LAB — I-STAT CHEM 8, ED
BUN: 15 mg/dL (ref 6–20)
CALCIUM ION: 1.19 mmol/L (ref 1.15–1.40)
CREATININE: 0.7 mg/dL (ref 0.44–1.00)
Chloride: 102 mmol/L (ref 101–111)
GLUCOSE: 101 mg/dL — AB (ref 65–99)
HCT: 44 % (ref 36.0–46.0)
Hemoglobin: 15 g/dL (ref 12.0–15.0)
Potassium: 4.1 mmol/L (ref 3.5–5.1)
Sodium: 139 mmol/L (ref 135–145)
TCO2: 27 mmol/L (ref 0–100)

## 2016-08-02 NOTE — ED Triage Notes (Signed)
Per EMS report:  From home but has a caretaker lady that stays with pt during the day.  Per caretaker, pt normally gets out to rake leaves.  Hx dementia.  Starting yesterday had back pain and foul smelling urine w/no known falls.  EMS VS: 138/90, 84, 12.

## 2016-08-02 NOTE — ED Provider Notes (Signed)
[facility]-EMERGENCY DEPT Provider Note   CSN: [id number] Arrival date & time: 08/02/16  1212     History   Chief Complaint Chief Complaint  Patient presents with  . Back Pain    HPI Savannah Garrison is a 83 y.o. female.  This is a 83 year old female presents after having trouble getting off the toilet. He does have a history of mild dementia. Was on the commode had trouble standing up. Has had some lower lumbar sacral pain without recent history of trauma. No bowel or bladder dysfunction. No weakness to her legs. Does have a history of UTI and her daughter states that she's had weakness at this point the past that has been the cause. No recent fever, vomiting. No treatment use prior to arrival      Past Medical History:  Diagnosis Date  . Alzheimer's disease   . MCI (mild cognitive impairment)   . Vitamin D deficiency     Patient Active Problem List   Diagnosis Date Noted  . History of CVA (cerebrovascular accident) 06/01/2015  . UTI (urinary tract infection) 06/01/2015  . Encephalopathy, hypertensive 06/01/2015  . Dizziness 05/31/2015  . Elevated troponin 05/31/2015  . Hypertensive urgency 05/31/2015  . Mild dementia 11/05/2013  . Memory loss 11/05/2013    Past Surgical History:  Procedure Laterality Date  . DILATION AND CURETTAGE OF UTERUS    . TONSILLECTOMY AND ADENOIDECTOMY      OB History    No data available       Home Medications    Prior to Admission medications   Medication Sig Start Date End Date Taking? Authorizing Provider  aspirin EC 81 MG tablet Take 81 mg by mouth daily.    Historical Provider, MD  cephALEXin (KEFLEX) 500 MG capsule Take 1 capsule (500 mg total) by mouth 2 (two) times daily. 06/02/15   Meredeth Ide, MD  donepezil (ARICEPT) 10 MG tablet Take 1 tablet (10 mg total) by mouth at bedtime. 06/24/15   Suanne Marker, MD  hydrochlorothiazide (MICROZIDE) 12.5 MG capsule Take 1 capsule (12.5 mg total) by mouth daily. 06/02/15   Meredeth Ide, MD  lisinopril (ZESTRIL) 10 MG tablet Take 1 tablet (10 mg total) by mouth daily. 06/02/15   Meredeth Ide, MD  meloxicam (MOBIC) 7.5 MG tablet Take 7.5 mg by mouth daily. 05/28/15   Historical Provider, MD    Family History Family History  Problem Relation Age of Onset  . Lung cancer Mother   . Alzheimer's disease Father     Social History Social History  Substance Use Topics  . Smoking status: Never Smoker  . Smokeless tobacco: Never Used  . Alcohol use 0.0 oz/week     Comment: Burbon every night.      Allergies   Patient has no known allergies.   Review of Systems Review of Systems  All other systems reviewed and are negative.    Physical Exam Updated Vital Signs BP 130/74   Pulse 80   Temp 97.3 F (36.3 C) (Oral)   Resp 16   Ht 5\' 2"  (1.575 m)   Wt 54.4 kg   SpO2 95%   BMI 21.95 kg/m   Physical Exam  Constitutional: She is oriented to person, place, and time. She appears well-developed and well-nourished.  Non-toxic appearance. No distress.  HENT:  Head: Normocephalic and atraumatic.  Eyes: Conjunctivae, EOM and lids are normal. Pupils are equal, round, and reactive to light.  Neck: Normal range of motion.  Neck supple. No tracheal deviation present. No thyroid mass present.  Cardiovascular: Normal rate, regular rhythm and normal heart sounds.  Exam reveals no gallop.   No murmur heard. Pulmonary/Chest: Effort normal and breath sounds normal. No stridor. No respiratory distress. She has no decreased breath sounds. She has no wheezes. She has no rhonchi. She has no rales.  Abdominal: Soft. Normal appearance and bowel sounds are normal. She exhibits no distension. There is no tenderness. There is no rebound and no CVA tenderness.  Musculoskeletal: Normal range of motion. She exhibits no edema or tenderness.       Back:  Neurological: She is alert and oriented to person, place, and time. She has normal strength. No cranial nerve deficit or sensory deficit.  GCS eye subscore is 4. GCS verbal subscore is 5. GCS motor subscore is 6.  Skin: Skin is warm and dry. No abrasion and no rash noted.  Psychiatric: She has a normal mood and affect. Her speech is normal and behavior is normal.  Nursing note and vitals reviewed.    ED Treatments / Results  Labs (all labs ordered are listed, but only abnormal results are displayed) Labs Reviewed  URINE CULTURE  URINALYSIS, ROUTINE W REFLEX MICROSCOPIC  I-STAT CHEM 8, ED    EKG  EKG Interpretation None       Radiology No results found.  Procedures Procedures (including critical care time)  Medications Ordered in ED Medications - No data to display   Initial Impression / Assessment and Plan / ED Course  I have reviewed the triage vital signs and the nursing notes.  Pertinent labs & imaging results that were available during my care of the patient were reviewed by me and considered in my medical decision making (see chart for details).  Clinical Course     Patient's pulse oximetry per my dictation on room air was 99% with a good waveform. Patient is not cachectic. Urinalysis and isolate noted. Patient stable for discharge back to home  Final Clinical Impressions(s) / ED Diagnoses   Final diagnoses:  None    New Prescriptions New Prescriptions   No medications on file     Lorre Nick, MD 08/02/16 1529

## 2016-08-02 NOTE — ED Notes (Signed)
Patient transported to x-ray. ?

## 2016-08-02 NOTE — ED Notes (Signed)
Patient d/c'd self care.  F/U reviewed.  Patient verbalized understanding. 

## 2016-08-02 NOTE — ED Notes (Signed)
Patient to restroom via wheelchair.

## 2016-08-03 DIAGNOSIS — M545 Low back pain: Secondary | ICD-10-CM | POA: Diagnosis not present

## 2016-08-03 LAB — URINE CULTURE

## 2016-08-09 ENCOUNTER — Emergency Department (HOSPITAL_COMMUNITY)
Admission: EM | Admit: 2016-08-09 | Discharge: 2016-08-09 | Disposition: A | Discharge status: Home / Self Care | Payer: Medicare HMO | Attending: Emergency Medicine | Admitting: Emergency Medicine

## 2016-08-09 ENCOUNTER — Encounter (HOSPITAL_COMMUNITY): Payer: Self-pay | Admitting: Emergency Medicine

## 2016-08-09 DIAGNOSIS — R404 Transient alteration of awareness: Secondary | ICD-10-CM | POA: Diagnosis not present

## 2016-08-09 DIAGNOSIS — Z8673 Personal history of transient ischemic attack (TIA), and cerebral infarction without residual deficits: Secondary | ICD-10-CM | POA: Diagnosis not present

## 2016-08-09 DIAGNOSIS — G309 Alzheimer's disease, unspecified: Secondary | ICD-10-CM | POA: Insufficient documentation

## 2016-08-09 DIAGNOSIS — Z7982 Long term (current) use of aspirin: Secondary | ICD-10-CM | POA: Insufficient documentation

## 2016-08-09 DIAGNOSIS — R4182 Altered mental status, unspecified: Secondary | ICD-10-CM | POA: Diagnosis present

## 2016-08-09 DIAGNOSIS — R402421 Glasgow coma scale score 9-12, in the field [EMT or ambulance]: Secondary | ICD-10-CM | POA: Diagnosis not present

## 2016-08-09 DIAGNOSIS — Z79899 Other long term (current) drug therapy: Secondary | ICD-10-CM | POA: Insufficient documentation

## 2016-08-09 DIAGNOSIS — I10 Essential (primary) hypertension: Secondary | ICD-10-CM | POA: Diagnosis not present

## 2016-08-09 DIAGNOSIS — F0280 Dementia in other diseases classified elsewhere without behavioral disturbance: Secondary | ICD-10-CM | POA: Insufficient documentation

## 2016-08-09 DIAGNOSIS — R6889 Other general symptoms and signs: Secondary | ICD-10-CM | POA: Diagnosis not present

## 2016-08-09 NOTE — ED Notes (Signed)
Snacks given 

## 2016-08-09 NOTE — ED Triage Notes (Signed)
Per EMS: Care giver stated that after going to the bathroom, pt lay down on the couch. In a while pt did not responded. She saw pt's eyes moving, but nothing else moved, did not follow commands. Care giver called 911. During route patient started to move and converse. Slightly agitated.

## 2016-08-10 NOTE — ED Provider Notes (Signed)
[facility]-EMERGENCY DEPT Provider Note   CSN: [id number] Arrival date & time: 08/09/16  1820     History   Chief Complaint Chief Complaint  Patient presents with  . Altered Mental Status    HPI Savannah Garrison is a 83 y.o. female.  The history is provided by a relative.  Altered Mental Status   This is a new problem. The current episode started 1 to 2 hours ago. The problem has been resolved. Associated symptoms include confusion (staring spell). Her past medical history is significant for dementia.    Past Medical History:  Diagnosis Date  . Alzheimer's disease   . MCI (mild cognitive impairment)   . Vitamin D deficiency     Patient Active Problem List   Diagnosis Date Noted  . History of CVA (cerebrovascular accident) 06/01/2015  . UTI (urinary tract infection) 06/01/2015  . Encephalopathy, hypertensive 06/01/2015  . Dizziness 05/31/2015  . Elevated troponin 05/31/2015  . Hypertensive urgency 05/31/2015  . Mild dementia 11/05/2013  . Memory loss 11/05/2013    Past Surgical History:  Procedure Laterality Date  . DILATION AND CURETTAGE OF UTERUS    . TONSILLECTOMY AND ADENOIDECTOMY      OB History    No data available       Home Medications    Prior to Admission medications   Medication Sig Start Date End Date Taking? Authorizing Provider  aspirin EC 81 MG tablet Take 81 mg by mouth daily.   Yes Historical Provider, MD  donepezil (ARICEPT) 10 MG tablet Take 1 tablet (10 mg total) by mouth at bedtime. 06/24/15  Yes Suanne Marker, MD  LORazepam (ATIVAN) 0.5 MG tablet Take 0.5 mg by mouth every morning. 06/03/16  Yes Historical Provider, MD  naproxen sodium (ANAPROX) 220 MG tablet Take 220 mg by mouth 2 (two) times daily as needed (pain).   Yes Historical Provider, MD    Family History Family History  Problem Relation Age of Onset  . Lung cancer Mother   . Alzheimer's disease Father     Social History Social History  Substance Use Topics  .  Smoking status: Never Smoker  . Smokeless tobacco: Never Used  . Alcohol use 0.0 oz/week     Comment: Burbon every night.      Allergies   Patient has no known allergies.   Review of Systems Review of Systems  Psychiatric/Behavioral: Positive for confusion (staring spell).  All other systems reviewed and are negative.    Physical Exam Updated Vital Signs BP 142/72 (BP Location: Right Arm)   Pulse 79   Temp 98.1 F (36.7 C) (Oral)   Resp 16   SpO2 98%   Physical Exam  Constitutional: She is oriented to person, place, and time. She appears well-developed and well-nourished. No distress.  HENT:  Head: Normocephalic.  Nose: Nose normal.  Eyes: Conjunctivae are normal.  Neck: Neck supple. No tracheal deviation present.  Cardiovascular: Normal rate and regular rhythm.   Pulmonary/Chest: Effort normal. No respiratory distress.  Abdominal: Soft. She exhibits no distension.  Neurological: She is alert and oriented to person, place, and time. No cranial nerve deficit. She exhibits normal muscle tone. Coordination normal.  Normal finger to nose testing and rapid alternating movement. Normal strength and sensation.  Skin: Skin is warm and dry. Capillary refill takes less than 2 seconds.  Psychiatric: She has a normal mood and affect.     ED Treatments / Results  Labs (all labs ordered are listed, but only  abnormal results are displayed) Labs Reviewed - No data to display  EKG  EKG Interpretation None       Radiology No results found.  Procedures Procedures (including critical care time)  Medications Ordered in ED Medications - No data to display   Initial Impression / Assessment and Plan / ED Course  I have reviewed the triage vital signs and the nursing notes.  Pertinent labs & imaging results that were available during my care of the patient were reviewed by me and considered in my medical decision making (see chart for details).  Clinical Course     83  y.o. female presents with episode of string earlier today where she was not able to respond to her caregiver. EMS was called and by the time of arrival Pt had resolved. Recently seen and no UTI on workup and electrolytes normal. No deficits and well appearing at baseline here. Discussed with family and will monitor in ED for recurrence, this appears to be a brief spell of decreased responsiveness that resolved spontaneously and may be related to dementia but no signs of stroke or metabolic disturbance as she has completely resolved. Plan to follow up with PCP as needed and return precautions discussed for worsening or new concerning symptoms.   Final Clinical Impressions(s) / ED Diagnoses   Final diagnoses:  Transient alteration of awareness    New Prescriptions Discharge Medication List as of 08/09/2016 10:04 PM       Lyndal Pulley, MD 08/10/16 [phone]

## 2016-08-16 DIAGNOSIS — L8990 Pressure ulcer of unspecified site, unspecified stage: Secondary | ICD-10-CM | POA: Diagnosis not present

## 2016-08-16 DIAGNOSIS — R451 Restlessness and agitation: Secondary | ICD-10-CM | POA: Diagnosis not present

## 2016-08-18 DIAGNOSIS — F0281 Dementia in other diseases classified elsewhere with behavioral disturbance: Secondary | ICD-10-CM | POA: Diagnosis not present

## 2016-08-18 DIAGNOSIS — I10 Essential (primary) hypertension: Secondary | ICD-10-CM | POA: Diagnosis not present

## 2016-08-18 DIAGNOSIS — M810 Age-related osteoporosis without current pathological fracture: Secondary | ICD-10-CM | POA: Diagnosis not present

## 2016-08-18 DIAGNOSIS — L89152 Pressure ulcer of sacral region, stage 2: Secondary | ICD-10-CM | POA: Diagnosis not present

## 2016-08-18 DIAGNOSIS — G309 Alzheimer's disease, unspecified: Secondary | ICD-10-CM | POA: Diagnosis not present

## 2016-08-22 DIAGNOSIS — L8992 Pressure ulcer of unspecified site, stage 2: Secondary | ICD-10-CM | POA: Diagnosis not present

## 2016-08-22 DIAGNOSIS — G309 Alzheimer's disease, unspecified: Secondary | ICD-10-CM | POA: Diagnosis not present

## 2016-08-22 DIAGNOSIS — F0281 Dementia in other diseases classified elsewhere with behavioral disturbance: Secondary | ICD-10-CM | POA: Diagnosis not present

## 2016-08-22 DIAGNOSIS — M810 Age-related osteoporosis without current pathological fracture: Secondary | ICD-10-CM | POA: Diagnosis not present

## 2016-08-22 DIAGNOSIS — L89152 Pressure ulcer of sacral region, stage 2: Secondary | ICD-10-CM | POA: Diagnosis not present

## 2016-08-22 DIAGNOSIS — I10 Essential (primary) hypertension: Secondary | ICD-10-CM | POA: Diagnosis not present

## 2016-08-22 DIAGNOSIS — L8990 Pressure ulcer of unspecified site, unspecified stage: Secondary | ICD-10-CM | POA: Diagnosis not present

## 2016-08-24 DIAGNOSIS — M810 Age-related osteoporosis without current pathological fracture: Secondary | ICD-10-CM | POA: Diagnosis not present

## 2016-08-24 DIAGNOSIS — G309 Alzheimer's disease, unspecified: Secondary | ICD-10-CM | POA: Diagnosis not present

## 2016-08-24 DIAGNOSIS — L89152 Pressure ulcer of sacral region, stage 2: Secondary | ICD-10-CM | POA: Diagnosis not present

## 2016-08-24 DIAGNOSIS — F0281 Dementia in other diseases classified elsewhere with behavioral disturbance: Secondary | ICD-10-CM | POA: Diagnosis not present

## 2016-08-24 DIAGNOSIS — I10 Essential (primary) hypertension: Secondary | ICD-10-CM | POA: Diagnosis not present

## 2016-08-29 DIAGNOSIS — F0281 Dementia in other diseases classified elsewhere with behavioral disturbance: Secondary | ICD-10-CM | POA: Diagnosis not present

## 2016-08-29 DIAGNOSIS — G309 Alzheimer's disease, unspecified: Secondary | ICD-10-CM | POA: Diagnosis not present

## 2016-08-29 DIAGNOSIS — L89152 Pressure ulcer of sacral region, stage 2: Secondary | ICD-10-CM | POA: Diagnosis not present

## 2016-08-29 DIAGNOSIS — I10 Essential (primary) hypertension: Secondary | ICD-10-CM | POA: Diagnosis not present

## 2016-08-29 DIAGNOSIS — M810 Age-related osteoporosis without current pathological fracture: Secondary | ICD-10-CM | POA: Diagnosis not present

## 2016-08-31 DIAGNOSIS — L89152 Pressure ulcer of sacral region, stage 2: Secondary | ICD-10-CM | POA: Diagnosis not present

## 2016-08-31 DIAGNOSIS — I10 Essential (primary) hypertension: Secondary | ICD-10-CM | POA: Diagnosis not present

## 2016-08-31 DIAGNOSIS — M810 Age-related osteoporosis without current pathological fracture: Secondary | ICD-10-CM | POA: Diagnosis not present

## 2016-08-31 DIAGNOSIS — F0281 Dementia in other diseases classified elsewhere with behavioral disturbance: Secondary | ICD-10-CM | POA: Diagnosis not present

## 2016-08-31 DIAGNOSIS — G309 Alzheimer's disease, unspecified: Secondary | ICD-10-CM | POA: Diagnosis not present

## 2016-09-05 DIAGNOSIS — L89152 Pressure ulcer of sacral region, stage 2: Secondary | ICD-10-CM | POA: Diagnosis not present

## 2016-09-05 DIAGNOSIS — G309 Alzheimer's disease, unspecified: Secondary | ICD-10-CM | POA: Diagnosis not present

## 2016-09-05 DIAGNOSIS — F0281 Dementia in other diseases classified elsewhere with behavioral disturbance: Secondary | ICD-10-CM | POA: Diagnosis not present

## 2016-09-05 DIAGNOSIS — M810 Age-related osteoporosis without current pathological fracture: Secondary | ICD-10-CM | POA: Diagnosis not present

## 2016-09-05 DIAGNOSIS — I10 Essential (primary) hypertension: Secondary | ICD-10-CM | POA: Diagnosis not present

## 2016-09-06 ENCOUNTER — Encounter (HOSPITAL_BASED_OUTPATIENT_CLINIC_OR_DEPARTMENT_OTHER): Payer: Medicare HMO | Attending: Surgery

## 2016-09-06 DIAGNOSIS — G309 Alzheimer's disease, unspecified: Secondary | ICD-10-CM | POA: Insufficient documentation

## 2016-09-06 DIAGNOSIS — L89153 Pressure ulcer of sacral region, stage 3: Secondary | ICD-10-CM | POA: Diagnosis not present

## 2016-09-06 DIAGNOSIS — E441 Mild protein-calorie malnutrition: Secondary | ICD-10-CM | POA: Diagnosis not present

## 2016-09-06 DIAGNOSIS — M810 Age-related osteoporosis without current pathological fracture: Secondary | ICD-10-CM | POA: Diagnosis not present

## 2016-09-06 DIAGNOSIS — F0280 Dementia in other diseases classified elsewhere without behavioral disturbance: Secondary | ICD-10-CM | POA: Insufficient documentation

## 2016-09-06 DIAGNOSIS — I10 Essential (primary) hypertension: Secondary | ICD-10-CM | POA: Insufficient documentation

## 2016-09-08 DIAGNOSIS — F0281 Dementia in other diseases classified elsewhere with behavioral disturbance: Secondary | ICD-10-CM | POA: Diagnosis not present

## 2016-09-08 DIAGNOSIS — L89152 Pressure ulcer of sacral region, stage 2: Secondary | ICD-10-CM | POA: Diagnosis not present

## 2016-09-08 DIAGNOSIS — G309 Alzheimer's disease, unspecified: Secondary | ICD-10-CM | POA: Diagnosis not present

## 2016-09-08 DIAGNOSIS — M810 Age-related osteoporosis without current pathological fracture: Secondary | ICD-10-CM | POA: Diagnosis not present

## 2016-09-08 DIAGNOSIS — I10 Essential (primary) hypertension: Secondary | ICD-10-CM | POA: Diagnosis not present

## 2016-09-12 DIAGNOSIS — M810 Age-related osteoporosis without current pathological fracture: Secondary | ICD-10-CM | POA: Diagnosis not present

## 2016-09-12 DIAGNOSIS — I10 Essential (primary) hypertension: Secondary | ICD-10-CM | POA: Diagnosis not present

## 2016-09-12 DIAGNOSIS — F0281 Dementia in other diseases classified elsewhere with behavioral disturbance: Secondary | ICD-10-CM | POA: Diagnosis not present

## 2016-09-12 DIAGNOSIS — L89152 Pressure ulcer of sacral region, stage 2: Secondary | ICD-10-CM | POA: Diagnosis not present

## 2016-09-12 DIAGNOSIS — G309 Alzheimer's disease, unspecified: Secondary | ICD-10-CM | POA: Diagnosis not present

## 2016-09-13 DIAGNOSIS — F0280 Dementia in other diseases classified elsewhere without behavioral disturbance: Secondary | ICD-10-CM | POA: Diagnosis not present

## 2016-09-13 DIAGNOSIS — M810 Age-related osteoporosis without current pathological fracture: Secondary | ICD-10-CM | POA: Diagnosis not present

## 2016-09-13 DIAGNOSIS — G309 Alzheimer's disease, unspecified: Secondary | ICD-10-CM | POA: Diagnosis not present

## 2016-09-13 DIAGNOSIS — E441 Mild protein-calorie malnutrition: Secondary | ICD-10-CM | POA: Diagnosis not present

## 2016-09-13 DIAGNOSIS — I10 Essential (primary) hypertension: Secondary | ICD-10-CM | POA: Diagnosis not present

## 2016-09-13 DIAGNOSIS — L89153 Pressure ulcer of sacral region, stage 3: Secondary | ICD-10-CM | POA: Diagnosis not present

## 2016-09-13 DIAGNOSIS — L89152 Pressure ulcer of sacral region, stage 2: Secondary | ICD-10-CM | POA: Diagnosis not present

## 2016-09-14 DIAGNOSIS — L89152 Pressure ulcer of sacral region, stage 2: Secondary | ICD-10-CM | POA: Diagnosis not present

## 2016-09-14 DIAGNOSIS — M810 Age-related osteoporosis without current pathological fracture: Secondary | ICD-10-CM | POA: Diagnosis not present

## 2016-09-14 DIAGNOSIS — I10 Essential (primary) hypertension: Secondary | ICD-10-CM | POA: Diagnosis not present

## 2016-09-14 DIAGNOSIS — F0281 Dementia in other diseases classified elsewhere with behavioral disturbance: Secondary | ICD-10-CM | POA: Diagnosis not present

## 2016-09-14 DIAGNOSIS — G309 Alzheimer's disease, unspecified: Secondary | ICD-10-CM | POA: Diagnosis not present

## 2016-09-18 ENCOUNTER — Other Ambulatory Visit: Payer: Self-pay | Admitting: Diagnostic Neuroimaging

## 2016-09-19 DIAGNOSIS — G309 Alzheimer's disease, unspecified: Secondary | ICD-10-CM | POA: Diagnosis not present

## 2016-09-19 DIAGNOSIS — F0281 Dementia in other diseases classified elsewhere with behavioral disturbance: Secondary | ICD-10-CM | POA: Diagnosis not present

## 2016-09-19 DIAGNOSIS — L89152 Pressure ulcer of sacral region, stage 2: Secondary | ICD-10-CM | POA: Diagnosis not present

## 2016-09-19 DIAGNOSIS — M810 Age-related osteoporosis without current pathological fracture: Secondary | ICD-10-CM | POA: Diagnosis not present

## 2016-09-19 DIAGNOSIS — I10 Essential (primary) hypertension: Secondary | ICD-10-CM | POA: Diagnosis not present

## 2016-09-20 DIAGNOSIS — L89152 Pressure ulcer of sacral region, stage 2: Secondary | ICD-10-CM | POA: Diagnosis not present

## 2016-09-20 DIAGNOSIS — G309 Alzheimer's disease, unspecified: Secondary | ICD-10-CM | POA: Diagnosis not present

## 2016-09-20 DIAGNOSIS — L89153 Pressure ulcer of sacral region, stage 3: Secondary | ICD-10-CM | POA: Diagnosis not present

## 2016-09-20 DIAGNOSIS — I10 Essential (primary) hypertension: Secondary | ICD-10-CM | POA: Diagnosis not present

## 2016-09-20 DIAGNOSIS — F0280 Dementia in other diseases classified elsewhere without behavioral disturbance: Secondary | ICD-10-CM | POA: Diagnosis not present

## 2016-09-20 DIAGNOSIS — E441 Mild protein-calorie malnutrition: Secondary | ICD-10-CM | POA: Diagnosis not present

## 2016-09-20 DIAGNOSIS — M810 Age-related osteoporosis without current pathological fracture: Secondary | ICD-10-CM | POA: Diagnosis not present

## 2016-09-21 DIAGNOSIS — M810 Age-related osteoporosis without current pathological fracture: Secondary | ICD-10-CM | POA: Diagnosis not present

## 2016-09-21 DIAGNOSIS — I10 Essential (primary) hypertension: Secondary | ICD-10-CM | POA: Diagnosis not present

## 2016-09-21 DIAGNOSIS — G309 Alzheimer's disease, unspecified: Secondary | ICD-10-CM | POA: Diagnosis not present

## 2016-09-21 DIAGNOSIS — F0281 Dementia in other diseases classified elsewhere with behavioral disturbance: Secondary | ICD-10-CM | POA: Diagnosis not present

## 2016-09-21 DIAGNOSIS — L89152 Pressure ulcer of sacral region, stage 2: Secondary | ICD-10-CM | POA: Diagnosis not present

## 2016-09-26 DIAGNOSIS — G309 Alzheimer's disease, unspecified: Secondary | ICD-10-CM | POA: Diagnosis not present

## 2016-09-26 DIAGNOSIS — M810 Age-related osteoporosis without current pathological fracture: Secondary | ICD-10-CM | POA: Diagnosis not present

## 2016-09-26 DIAGNOSIS — I10 Essential (primary) hypertension: Secondary | ICD-10-CM | POA: Diagnosis not present

## 2016-09-26 DIAGNOSIS — F0281 Dementia in other diseases classified elsewhere with behavioral disturbance: Secondary | ICD-10-CM | POA: Diagnosis not present

## 2016-09-26 DIAGNOSIS — L89152 Pressure ulcer of sacral region, stage 2: Secondary | ICD-10-CM | POA: Diagnosis not present

## 2016-09-27 ENCOUNTER — Encounter (HOSPITAL_BASED_OUTPATIENT_CLINIC_OR_DEPARTMENT_OTHER): Payer: Medicare HMO | Attending: Surgery

## 2016-09-27 DIAGNOSIS — Z09 Encounter for follow-up examination after completed treatment for conditions other than malignant neoplasm: Secondary | ICD-10-CM | POA: Diagnosis not present

## 2016-09-27 DIAGNOSIS — G309 Alzheimer's disease, unspecified: Secondary | ICD-10-CM | POA: Diagnosis not present

## 2016-09-27 DIAGNOSIS — F0280 Dementia in other diseases classified elsewhere without behavioral disturbance: Secondary | ICD-10-CM | POA: Insufficient documentation

## 2016-09-27 DIAGNOSIS — Z872 Personal history of diseases of the skin and subcutaneous tissue: Secondary | ICD-10-CM | POA: Insufficient documentation

## 2016-09-27 DIAGNOSIS — I10 Essential (primary) hypertension: Secondary | ICD-10-CM | POA: Insufficient documentation

## 2016-09-27 DIAGNOSIS — L89159 Pressure ulcer of sacral region, unspecified stage: Secondary | ICD-10-CM | POA: Diagnosis not present

## 2016-09-28 DIAGNOSIS — L8990 Pressure ulcer of unspecified site, unspecified stage: Secondary | ICD-10-CM | POA: Diagnosis not present

## 2016-09-28 DIAGNOSIS — L8992 Pressure ulcer of unspecified site, stage 2: Secondary | ICD-10-CM | POA: Diagnosis not present

## 2016-09-29 DIAGNOSIS — L8992 Pressure ulcer of unspecified site, stage 2: Secondary | ICD-10-CM | POA: Diagnosis not present

## 2016-09-29 DIAGNOSIS — L8990 Pressure ulcer of unspecified site, unspecified stage: Secondary | ICD-10-CM | POA: Diagnosis not present

## 2016-10-06 DIAGNOSIS — G309 Alzheimer's disease, unspecified: Secondary | ICD-10-CM | POA: Diagnosis not present

## 2016-10-06 DIAGNOSIS — F0281 Dementia in other diseases classified elsewhere with behavioral disturbance: Secondary | ICD-10-CM | POA: Diagnosis not present

## 2016-10-06 DIAGNOSIS — L89152 Pressure ulcer of sacral region, stage 2: Secondary | ICD-10-CM | POA: Diagnosis not present

## 2016-10-06 DIAGNOSIS — I10 Essential (primary) hypertension: Secondary | ICD-10-CM | POA: Diagnosis not present

## 2016-10-06 DIAGNOSIS — M810 Age-related osteoporosis without current pathological fracture: Secondary | ICD-10-CM | POA: Diagnosis not present

## 2016-11-17 ENCOUNTER — Encounter: Payer: Self-pay | Admitting: *Deleted

## 2016-11-18 ENCOUNTER — Encounter: Payer: Self-pay | Admitting: *Deleted

## 2016-11-21 ENCOUNTER — Encounter: Payer: Self-pay | Admitting: Diagnostic Neuroimaging

## 2016-11-21 ENCOUNTER — Ambulatory Visit (INDEPENDENT_AMBULATORY_CARE_PROVIDER_SITE_OTHER): Payer: Medicare HMO | Admitting: Diagnostic Neuroimaging

## 2016-11-21 VITALS — BP 105/66 | HR 78 | Ht 61.0 in | Wt 104.2 lb

## 2016-11-21 DIAGNOSIS — F03B18 Unspecified dementia, moderate, with other behavioral disturbance: Secondary | ICD-10-CM

## 2016-11-21 DIAGNOSIS — R269 Unspecified abnormalities of gait and mobility: Secondary | ICD-10-CM

## 2016-11-21 DIAGNOSIS — F0391 Unspecified dementia with behavioral disturbance: Secondary | ICD-10-CM | POA: Diagnosis not present

## 2016-11-21 MED ORDER — QUETIAPINE FUMARATE 25 MG PO TABS: 25.0000 mg | ORAL_TABLET | Freq: Every evening | ORAL | 12 refills | Status: DC | PRN

## 2016-11-21 MED ORDER — SERTRALINE HCL 25 MG PO TABS: 25.0000 mg | ORAL_TABLET | Freq: Every day | ORAL | 12 refills | Status: DC

## 2016-11-21 NOTE — Progress Notes (Signed)
[facility]  PATIENT: Savannah Garrison DOB: 04-29-1930  REFERRING CLINICIAN:  HISTORY FROM: patient, daughter REASON FOR VISIT: follow up    HISTORICAL  CHIEF COMPLAINT:  Chief Complaint  Patient presents with  . Dementia    rm 6, dgtrToniann Fail, MMSE 13  . Follow-up    last seen 04/2015    HISTORY OF PRESENT ILLNESS:   UPDATE 11/21/16: Since last visit, more wandering behavior, angry outbursts, behavior changes. Now on ativan 0.5mg  daily and seroquel 25mg  at bedtime (x 1 week). These behaviors more significant since pt husband passed away in Aug 21, 2016. Now pt has 24 hour caregiver.   UPDATE 05/18/15: Since last visit, continued decline. No longer driving. Minimal use of toaster. No more major cooking. Still light yard work. Memory loss is progression.  UPDATE 11/05/13 (VRP): Doing well. No new issues. Still cooks, cleans, cleans yard, drives a little. Tolerating donepezil.  UPDATE 05/03/13 (LL): since last visit, no significant change. Patient did not tolerate Namenda due to stomach upset, confusion. They have not followed up on B12 as requested. BP is much better in office today, 143/83. Patient has no complaints.  UPDATE 12/25/12: since last visit, patient has had progression of memory loss. This is noted by patient and her family. She is misplacing more objects in the home and kitchen. She is repeating herself more often. 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 patient is tolerating donepezil.   UPDATE 09/09/11: Memory loss has slightly progressed. Forgeting names of people. Repeats herself more. More problems in unfamiliar situations. Still drives without problems, although no one has been in the car with her when she is driving. No probs with cooking.   PRIOR HPI (02/18/10): 83 year old right-handed female with no significant past medical history presenting  for evaluation of memory difficulty over the past few months. He is accompanied by her husband at this visit.   Patient reports some difficulty in conversation where in mid sentence she forgets what word to say next. This has been intermittent over the past few months. She thinks it's been less than 6 months. She is concerned because her father and paternal uncle both were diagnosed with Alzheimer's disease in their 62s. Patient has noticed the symptoms herself, but her husband and her daughter have noticed it more. Patient is not that bothered by her symptoms. She continues to drive, is able to shop, maintains household finances and also does cooking and other house chores.   REVIEW OF SYSTEMS: Full 14 system review of systems performed and negative except: insomnia memory loss anxiety weight loss incontinence.    ALLERGIES: No Known Allergies  HOME MEDICATIONS: Outpatient Medications Prior to Visit  Medication Sig Dispense Refill  . aspirin EC 81 MG tablet Take 81 mg by mouth daily.    Marland Kitchen donepezil (ARICEPT) 10 MG tablet Take 1 tablet (10 mg total) by mouth at bedtime. 90 tablet 4  . LORazepam (ATIVAN) 0.5 MG tablet Take 0.5 mg by mouth every morning.    . naproxen sodium (ANAPROX) 220 MG tablet Take 220 mg by mouth 2 (two) times daily as needed (pain).     No facility-administered medications prior to visit.     PAST MEDICAL HISTORY: Past Medical History:  Diagnosis Date  . Alzheimer's disease   . History of bilateral cataract extraction   . Hyperlipidemia   . MCI (mild cognitive impairment)   .  Osteoporosis   . Vitamin B12 deficiency   . Vitamin D deficiency     PAST SURGICAL HISTORY: Past Surgical History:  Procedure Laterality Date  . CATARACT EXTRACTION    . DILATION AND CURETTAGE OF UTERUS    . TONSILLECTOMY AND ADENOIDECTOMY      FAMILY HISTORY: Family History  Problem Relation Age of Onset  . Lung cancer Mother   . Alzheimer's disease Father     SOCIAL  HISTORY:  Social History   Social History  . Marital status: Married    Spouse name: Aldrad  . Number of children: 2  . Years of education: College   Occupational History  . Retired    Social History Main Topics  . Smoking status: Never Smoker  . Smokeless tobacco: Never Used  . Alcohol use No     Comment: 11/21/16 none per dgtr  . Drug use: No  . Sexual activity: Not on file   Other Topics Concern  . Not on file   Social History Narrative   11/21/16 Pt lives at home with 24 hr caregiver.   Has been married for 53+ years.   Education [employer].   Right handed.   Caffeine Use: 1-2 glasses daily (green tea)     PHYSICAL EXAM  Vitals:   11/21/16 1022  BP: 105/66  Pulse: 78  Weight: 104 lb 3.2 oz (47.3 kg)  Height: 5\' 1"  (1.549 m)    Not recorded      Body mass index is 19.69 kg/m.  MMSE - Mini Mental State Exam 11/21/2016 05/18/2015 11/11/2014  Orientation to time 0 1 0  Orientation to Place 1 2 2   Registration 3 3 3   Attention/ Calculation 1 1 2   Recall 0 0 3  Language- name 2 objects 2 2 2   Language- repeat 1 0 1  Language- follow 3 step command 3 3 3   Language- read & follow direction 1 1 1   Write a sentence 1 0 1  Copy design 0 0 0  Total score 13 13 18     GENERAL EXAM:  Patient is in no distress   CARDIOVASCULAR:  Regular rate and rhythm, no murmurs, no carotid bruits   NEUROLOGIC:  MENTAL STATUS: awake, alert, language fluent, comprehension intact, naming intact; POSITIVE MYERSONS AND PALMOMENTAL.  CRANIAL NERVE: pupils equal and reactive to light, visual fields full to confrontation, extraocular muscles intact, no nystagmus, facial sensation and strength symmetric, uvula midline, shoulder shrug symmetric, tongue midline.  MOTOR: normal bulk and tone, full strength in the BUE, BLE; MODERATE BRADYKINESIA IN BUE AND BLE  SENSORY: normal and symmetric to light touch, temperature, vibration  COORDINATION: finger-nose-finger, fine finger movements  normal  REFLEXES: deep tendon reflexes present and symmetric  GAIT/STATION: SLOW ANTALGIC GAIT. USES ROLLATOR WALKER.     DIAGNOSTIC DATA (LABS, IMAGING, TESTING) - I reviewed patient records, labs, notes, testing and imaging myself where available.  Lab Results  Component Value Date   WBC 10.5 05/31/2015   HGB 15.0 08/02/2016   HCT 44.0 08/02/2016   MCV 93.0 05/31/2015   PLT 250 05/31/2015      Component Value Date/Time   NA 139 08/02/2016 1346   K 4.1 08/02/2016 1346   CL 102 08/02/2016 1346   CO2 26 06/02/2015 0435   GLUCOSE 101 (H) 08/02/2016 1346   BUN 15 08/02/2016 1346   CREATININE 0.70 08/02/2016 1346   CALCIUM 8.9 06/02/2015 0435   PROT 6.1 (L) 06/02/2015 0435   ALBUMIN 3.4 (L)  06/02/2015 0435   AST 27 06/02/2015 0435   ALT 13 (L) 06/02/2015 0435   ALKPHOS 73 06/02/2015 0435   BILITOT 0.8 06/02/2015 0435   GFRNONAA >60 06/02/2015 0435   GFRAA >60 06/02/2015 0435   Lab Results  Component Value Date   CHOL 185 06/02/2015   No results found for: HGBA1C No results found for: VITAMINB12 No results found for: TSH   10/07/10  B12 - 138 (L)  TSH 0.64  RPR NR   09/23/11 MRI brain - mild chronic microvascular ischemia and supratentorial cortical atrophy    ASSESSMENT AND PLAN  83 y.o. year old female here with moderate dementia (progressive short-term memory loss, progressive loss of ADLs). Now with some behavior and agitation issues.   MMSE 02/18/10 - 25 09/09/11 - 22 12/25/12 - 22 05/03/13 - 23 11/05/13 - 22 11/11/14 - 18 05/18/15 - 13 11/21/16 - 13  Dx: moderate-severe dementia with behavior changes  Moderate dementia with behavioral disturbance  Gait difficulty    PLAN:   - continue 24 hour caregiver / aid - continue lorazepam 0.5mg  daily - change seroquel 25mg  to as needed for severe agitation - start sertraline 25mg  daily for mood stabilization  - continue rollator walker; patient is high fall risk  Meds ordered this encounter    Medications  . QUEtiapine (SEROQUEL) 25 MG tablet    Sig: Take 1 tablet (25 mg total) by mouth at bedtime as needed (agitation).    Dispense:  30 tablet    Refill:  12  . sertraline (ZOLOFT) 25 MG tablet    Sig: Take 1 tablet (25 mg total) by mouth daily.    Dispense:  30 tablet    Refill:  12   Return in about 3 months (around 02/20/2017).    Suanne Marker, MD 11/21/2016, 10:45 AM Certified in Neurology, Neurophysiology and Neuroimaging  [facility] [address] [place], [place] [address] [phone]

## 2016-11-21 NOTE — Patient Instructions (Signed)
-   continue 24 hour caregiver / aid  - continue lorazepam 0.5mg  daily  - change seroquel 25mg  to as needed at bedtime for severe agitation  - start sertraline 25mg  daily for mood stabilization

## 2016-12-12 DIAGNOSIS — S8012XA Contusion of left lower leg, initial encounter: Secondary | ICD-10-CM | POA: Diagnosis not present

## 2016-12-12 DIAGNOSIS — T148XXA Other injury of unspecified body region, initial encounter: Secondary | ICD-10-CM | POA: Diagnosis not present

## 2017-02-25 ENCOUNTER — Emergency Department (HOSPITAL_COMMUNITY): Payer: Medicare HMO

## 2017-02-25 ENCOUNTER — Emergency Department (HOSPITAL_COMMUNITY)
Admission: EM | Admit: 2017-02-25 | Discharge: 2017-02-26 | Disposition: A | Discharge status: Home / Self Care | Payer: Medicare HMO | Attending: Emergency Medicine | Admitting: Emergency Medicine

## 2017-02-25 ENCOUNTER — Encounter (HOSPITAL_COMMUNITY): Payer: Self-pay

## 2017-02-25 DIAGNOSIS — W010XXA Fall on same level from slipping, tripping and stumbling without subsequent striking against object, initial encounter: Secondary | ICD-10-CM | POA: Insufficient documentation

## 2017-02-25 DIAGNOSIS — S12400A Unspecified displaced fracture of fifth cervical vertebra, initial encounter for closed fracture: Secondary | ICD-10-CM | POA: Diagnosis not present

## 2017-02-25 DIAGNOSIS — Y9301 Activity, walking, marching and hiking: Secondary | ICD-10-CM | POA: Insufficient documentation

## 2017-02-25 DIAGNOSIS — M179 Osteoarthritis of knee, unspecified: Secondary | ICD-10-CM | POA: Diagnosis not present

## 2017-02-25 DIAGNOSIS — S129XXA Fracture of neck, unspecified, initial encounter: Secondary | ICD-10-CM | POA: Diagnosis not present

## 2017-02-25 DIAGNOSIS — Z8673 Personal history of transient ischemic attack (TIA), and cerebral infarction without residual deficits: Secondary | ICD-10-CM | POA: Diagnosis not present

## 2017-02-25 DIAGNOSIS — M542 Cervicalgia: Secondary | ICD-10-CM | POA: Insufficient documentation

## 2017-02-25 DIAGNOSIS — S0181XA Laceration without foreign body of other part of head, initial encounter: Secondary | ICD-10-CM | POA: Diagnosis not present

## 2017-02-25 DIAGNOSIS — Z23 Encounter for immunization: Secondary | ICD-10-CM | POA: Insufficient documentation

## 2017-02-25 DIAGNOSIS — G309 Alzheimer's disease, unspecified: Secondary | ICD-10-CM | POA: Diagnosis not present

## 2017-02-25 DIAGNOSIS — Z79899 Other long term (current) drug therapy: Secondary | ICD-10-CM | POA: Diagnosis not present

## 2017-02-25 DIAGNOSIS — Y999 Unspecified external cause status: Secondary | ICD-10-CM | POA: Insufficient documentation

## 2017-02-25 DIAGNOSIS — S01112A Laceration without foreign body of left eyelid and periocular area, initial encounter: Secondary | ICD-10-CM | POA: Diagnosis not present

## 2017-02-25 DIAGNOSIS — S12300A Unspecified displaced fracture of fourth cervical vertebra, initial encounter for closed fracture: Secondary | ICD-10-CM | POA: Diagnosis not present

## 2017-02-25 DIAGNOSIS — Y92000 Kitchen of unspecified non-institutional (private) residence as  the place of occurrence of the external cause: Secondary | ICD-10-CM | POA: Diagnosis not present

## 2017-02-25 DIAGNOSIS — S0990XA Unspecified injury of head, initial encounter: Secondary | ICD-10-CM | POA: Diagnosis not present

## 2017-02-25 DIAGNOSIS — S0101XA Laceration without foreign body of scalp, initial encounter: Secondary | ICD-10-CM | POA: Diagnosis not present

## 2017-02-25 DIAGNOSIS — S199XXA Unspecified injury of neck, initial encounter: Secondary | ICD-10-CM | POA: Diagnosis not present

## 2017-02-25 DIAGNOSIS — W19XXXA Unspecified fall, initial encounter: Secondary | ICD-10-CM

## 2017-02-25 DIAGNOSIS — Z7982 Long term (current) use of aspirin: Secondary | ICD-10-CM | POA: Diagnosis not present

## 2017-02-25 LAB — BASIC METABOLIC PANEL
ANION GAP: 8 (ref 5–15)
BUN: 15 mg/dL (ref 6–20)
CHLORIDE: 105 mmol/L (ref 101–111)
CO2: 31 mmol/L (ref 22–32)
Calcium: 9 mg/dL (ref 8.9–10.3)
Creatinine, Ser: 0.82 mg/dL (ref 0.44–1.00)
GFR calc Af Amer: >60 mL/min (ref >60)
Glucose, Bld: 131 mg/dL — ABNORMAL HIGH (ref 65–99)
POTASSIUM: 3.8 mmol/L (ref 3.5–5.1)
Sodium: 144 mmol/L (ref 135–145)

## 2017-02-25 LAB — CBC WITH DIFFERENTIAL/PLATELET
BASOS ABS: 0.1 10*3/uL (ref 0.0–0.1)
Basophils Relative: 1 %
EOS PCT: 1 %
Eosinophils Absolute: 0.1 10*3/uL (ref 0.0–0.7)
HCT: 35.4 % — ABNORMAL LOW (ref 36.0–46.0)
HEMOGLOBIN: 11.6 g/dL — AB (ref 12.0–15.0)
LYMPHS PCT: 9 %
Lymphs Abs: 0.9 10*3/uL (ref 0.7–4.0)
MCH: 28.9 pg (ref 26.0–34.0)
MCHC: 32.8 g/dL (ref 30.0–36.0)
MCV: 88.3 fL (ref 78.0–100.0)
Monocytes Absolute: 1.5 10*3/uL — ABNORMAL HIGH (ref 0.1–1.0)
Monocytes Relative: 14 %
NEUTROS ABS: 8.1 10*3/uL — AB (ref 1.7–7.7)
NEUTROS PCT: 75 %
PLATELETS: 196 10*3/uL (ref 150–400)
RBC: 4.01 MIL/uL (ref 3.87–5.11)
RDW: 16.2 % — ABNORMAL HIGH (ref 11.5–15.5)
WBC: 10.7 10*3/uL — AB (ref 4.0–10.5)

## 2017-02-25 MED ORDER — LIDOCAINE-EPINEPHRINE (PF) 2 %-1:200000 IJ SOLN
20.0000 mL | Freq: Once | INTRAMUSCULAR | Status: AC
  Administered 2017-02-25: 20 mL
  Filled 2017-02-25: qty 20

## 2017-02-25 MED ORDER — TETANUS-DIPHTH-ACELL PERTUSSIS 5-2.5-18.5 LF-MCG/0.5 IM SUSP
0.5000 mL | Freq: Once | INTRAMUSCULAR | Status: AC
  Administered 2017-02-25: 0.5 mL via INTRAMUSCULAR
  Filled 2017-02-25: qty 0.5

## 2017-02-25 MED ORDER — OXYCODONE-ACETAMINOPHEN 5-325 MG PO TABS: 1.0000 | ORAL_TABLET | Freq: Four times a day (QID) | ORAL | 0 refills | Status: DC | PRN

## 2017-02-25 MED ORDER — MORPHINE SULFATE (PF) 4 MG/ML IV SOLN
4.0000 mg | Freq: Once | INTRAVENOUS | Status: AC
  Administered 2017-02-25: 4 mg via INTRAVENOUS
  Filled 2017-02-25: qty 1

## 2017-02-25 MED ORDER — IOPAMIDOL (ISOVUE-370) INJECTION 76%
INTRAVENOUS | Status: AC
  Filled 2017-02-25: qty 100

## 2017-02-25 MED ORDER — IOPAMIDOL (ISOVUE-370) INJECTION 76%
100.0000 mL | Freq: Once | INTRAVENOUS | Status: AC | PRN
  Administered 2017-02-26: 100 mL via INTRAVENOUS

## 2017-02-25 MED ORDER — ACETAMINOPHEN 325 MG PO TABS
650.0000 mg | ORAL_TABLET | Freq: Once | ORAL | Status: AC
  Administered 2017-02-25: 650 mg via ORAL
  Filled 2017-02-25: qty 2

## 2017-02-25 NOTE — ED Provider Notes (Signed)
[facility]-EMERGENCY DEPT Provider Note   CSN: [id number] Arrival date & time: 02/25/17  1925     History   Chief Complaint Chief Complaint  Patient presents with  . Fall  . Head Injury    HPI Savannah Garrison is a 83 y.o. female with history of Alzheimer's disease, mild cognitive impairment presents to the emergency department after witnessed mechanical fall. Patient is alert and oriented to self and and place only. History provided by daughter at bedside. Fall was witnessed by caregiver. Reportedly, patient was walking in the kitchen with her walker when she lost balance and fell forward landing on a trashcan. No LOC. No vomiting. Daughter states patient is at her baseline cognitively. No anticoagulants. Patient reporting neck pain and right knee pain.  HPI  Past Medical History:  Diagnosis Date  . Alzheimer's disease   . History of bilateral cataract extraction   . Hyperlipidemia   . MCI (mild cognitive impairment)   . Osteoporosis   . Vitamin B12 deficiency   . Vitamin D deficiency     Patient Active Problem List   Diagnosis Date Noted  . History of CVA (cerebrovascular accident) 06/01/2015  . UTI (urinary tract infection) 06/01/2015  . Encephalopathy, hypertensive 06/01/2015  . Dizziness 05/31/2015  . Elevated troponin 05/31/2015  . Hypertensive urgency 05/31/2015  . Moderate dementia with behavioral disturbance 11/05/2013  . Memory loss 11/05/2013    Past Surgical History:  Procedure Laterality Date  . CATARACT EXTRACTION    . DILATION AND CURETTAGE OF UTERUS    . TONSILLECTOMY AND ADENOIDECTOMY      OB History    No data available       Home Medications    Prior to Admission medications   Medication Sig Start Date End Date Taking? Authorizing Provider  aspirin EC 81 MG tablet Take 81 mg by mouth daily.    [provider]  donepezil (ARICEPT) 10 MG tablet Take 1 tablet (10 mg total) by mouth at bedtime. 06/24/15   Penumalli, Glenford Bayley, MD    LORazepam (ATIVAN) 0.5 MG tablet Take 0.5 mg by mouth every morning. 06/03/16   [provider]  naproxen sodium (ANAPROX) 220 MG tablet Take 220 mg by mouth 2 (two) times daily as needed (pain).    [provider]  QUEtiapine (SEROQUEL) 25 MG tablet Take 1 tablet (25 mg total) by mouth at bedtime as needed (agitation). 11/21/16   Penumalli, Glenford Bayley, MD  sertraline (ZOLOFT) 25 MG tablet Take 1 tablet (25 mg total) by mouth daily. 11/21/16   Penumalli, Glenford Bayley, MD    Family History Family History  Problem Relation Age of Onset  . Lung cancer Mother   . Alzheimer's disease Father     Social History Social History  Substance Use Topics  . Smoking status: Never Smoker  . Smokeless tobacco: Never Used  . Alcohol use No     Comment: 11/21/16 none per dgtr     Allergies   Patient has no known allergies.   Review of Systems Review of Systems  Unable to perform ROS: Dementia  HENT: Positive for facial swelling.   Musculoskeletal: Positive for arthralgias and neck pain.     Physical Exam Updated Vital Signs BP 125/62 (BP Location: Left Arm)   Pulse 74   Temp 98.2 F (36.8 C) (Oral)   Resp 16   SpO2 100%   Physical Exam  Constitutional: She is oriented to person, place, and time. She appears well-developed and well-nourished.  She is cooperative. She is easily aroused. No distress.  Pt is in a towel collar   HENT:  Laceration to left eyebrow, bleeding controlled, appropriately tender No scalp, facial or nasal bone tenderness No Raccoon's eyes. No Battle's sign. No hemotympanum, bilaterally. No epistaxis, septum midline No intraoral bleeding or injury  Eyes:  Lids normal EOMs and PERRL intact without pain No conjunctival injection  Neck: Spinous process tenderness and muscular tenderness present.  Patient in towel collar Midline cervical spinous process tenderness 2+ carotid pulses bilaterally without bruits Trachea midline  Cardiovascular: Normal  rate, regular rhythm, S1 normal, S2 normal and normal heart sounds.  Exam reveals no distant heart sounds and no friction rub.   No murmur heard. Pulses:      Carotid pulses are 2+ on the right side, and 2+ on the left side.      Radial pulses are 2+ on the right side, and 2+ on the left side.       Dorsalis pedis pulses are 2+ on the right side, and 2+ on the left side.  Pulmonary/Chest: Effort normal. No respiratory distress. She has no decreased breath sounds.  No chest wall tenderness Equal and symmetric chest wall expansion   Abdominal:  Abdomen is soft NTND  Musculoskeletal: Normal range of motion. She exhibits no deformity.  Neurological: She is alert, oriented to person, place, and time and easily aroused.  A&O to self, place and time. Speech and phonation normal.  Strength 5/5 with hand grip, elbow F/E, ankle F/E Sensation to light touch intact in upper and lower extremities.  CN III, IV, VI PEERL and EOMs intact bilaterally CN V light touch intact in all 3 divisions of trigeminal nerve CN VII facial nerve movements intact, symmetric, bilaterally CN IX, X no uvula deviation, symmetric soft palate rise CN XII Tongue midline with symmetric L/R movement     ED Treatments / Results  Labs (all labs ordered are listed, but only abnormal results are displayed) Labs Reviewed  CBC WITH DIFFERENTIAL/PLATELET - Abnormal; Notable for the following:       Result Value   WBC 10.7 (*)    Hemoglobin 11.6 (*)    HCT 35.4 (*)    RDW 16.2 (*)    Neutro Abs 8.1 (*)    Monocytes Absolute 1.5 (*)    All other components within normal limits  BASIC METABOLIC PANEL - Abnormal; Notable for the following:    Glucose, Bld 131 (*)    All other components within normal limits  URINALYSIS, ROUTINE W REFLEX MICROSCOPIC    EKG  EKG Interpretation None       Radiology Ct Head Wo Contrast  Result Date: 02/25/2017 CLINICAL DATA:  83 y/o F; unwitnessed fall with multiple lacerations  including above the left eyebrow. EXAM: CT HEAD WITHOUT CONTRAST CT CERVICAL SPINE WITHOUT CONTRAST TECHNIQUE: Multidetector CT imaging of the head and cervical spine was performed following the standard protocol without intravenous contrast. Multiplanar CT image reconstructions of the cervical spine were also generated. COMPARISON:  None. FINDINGS: CT HEAD FINDINGS Brain: No evidence of acute infarction, hemorrhage, hydrocephalus, extra-axial collection or mass lesion/mass effect. Mild chronic microvascular ischemic changes and parenchymal volume loss of the brain for age. Vascular: Mild calcified plaque of carotid siphons. No hyperdense vessel identified. Skull: Left frontal scalp contusion and skin laceration. No displaced calvarial fracture. Sinuses/Orbits: No acute finding. Other: None. CT CERVICAL SPINE FINDINGS Alignment: Grade 1 C3-4 anterolisthesis and mild reversal of cervical curvature from C4  through C6. Widening of the left C5-6 facet joint and a 6 mm rightward subluxation of the C5 inferior facet relative to the C6 superior facet (series 11, image 32). Mild anterior widening of the C5-6 intervertebral disc space and posterior displacement of the C5 posterior elements relative to C6 likely representing disc and ligamentous injury (series 12, image 34). Skull base and vertebrae: C5 vertebral body minimally displaced acute fracture of the left lamina (Series 8, image 45), base of right C5 pedicle (series 8, image 50), and of the left C4 posterior tubercle of transverse process. The right C5 base of pedicle and at left C4 posterior tubercle transverse process fractures extend to the foramen transversarium. Soft tissues and spinal canal: No epidural hematoma identified. Mild prevertebral edema at the levels of spinal fracture. Disc levels: Moderate cervical spondylosis with discogenic disease greatest at the C5-6 level and left-sided facet arthropathy severe from C2 through C4. Left-sided C4-5 facet fusion.  Upper chest: Calcific atherosclerosis of carotid bifurcations. Other: Negative. IMPRESSION: CT head: 1. Left frontal scalp contusion and laceration. No displaced calvarial fracture. 2. No acute intracranial abnormality. 3. Mild for age chronic microvascular ischemic changes and mild parenchymal volume loss of the brain. CT cervical spine: 1. Widening of C5-6 left facet and lateral subluxation of the right facet indicating facet capsular injury. 2. Mild anterior widening of the C5-6 intervertebral disc space and posterior displacement of the C5 spinous process likely representing C5-6 disc and interspinous ligamentous injury. 3. Minimally displaced fractures of the left C5 lamina, base of right C5 pedicle, and left C4 transverse process. 4. Base of right C5 pedicle and left C4 transverse process fractures extend to foramen transversarium. Vertebral artery injury can be assessed with CT angiogram of the neck. 5. Mild prevertebral edema through level of fracture. No epidural hematoma identified. These results were called by telephone at the time of interpretation on 02/25/2017 at 9:13 pm to Dr. Sharen Heck , who verbally acknowledged these results. Electronically Signed   By: Mitzi Hansen M.D.   On: 02/25/2017 21:18   Ct Cervical Spine Wo Contrast  Result Date: 02/25/2017 CLINICAL DATA:  83 y/o F; unwitnessed fall with multiple lacerations including above the left eyebrow. EXAM: CT HEAD WITHOUT CONTRAST CT CERVICAL SPINE WITHOUT CONTRAST TECHNIQUE: Multidetector CT imaging of the head and cervical spine was performed following the standard protocol without intravenous contrast. Multiplanar CT image reconstructions of the cervical spine were also generated. COMPARISON:  None. FINDINGS: CT HEAD FINDINGS Brain: No evidence of acute infarction, hemorrhage, hydrocephalus, extra-axial collection or mass lesion/mass effect. Mild chronic microvascular ischemic changes and parenchymal volume loss of the brain  for age. Vascular: Mild calcified plaque of carotid siphons. No hyperdense vessel identified. Skull: Left frontal scalp contusion and skin laceration. No displaced calvarial fracture. Sinuses/Orbits: No acute finding. Other: None. CT CERVICAL SPINE FINDINGS Alignment: Grade 1 C3-4 anterolisthesis and mild reversal of cervical curvature from C4 through C6. Widening of the left C5-6 facet joint and a 6 mm rightward subluxation of the C5 inferior facet relative to the C6 superior facet (series 11, image 32). Mild anterior widening of the C5-6 intervertebral disc space and posterior displacement of the C5 posterior elements relative to C6 likely representing disc and ligamentous injury (series 12, image 34). Skull base and vertebrae: C5 vertebral body minimally displaced acute fracture of the left lamina (Series 8, image 45), base of right C5 pedicle (series 8, image 50), and of the left C4 posterior tubercle of transverse process. The  right C5 base of pedicle and at left C4 posterior tubercle transverse process fractures extend to the foramen transversarium. Soft tissues and spinal canal: No epidural hematoma identified. Mild prevertebral edema at the levels of spinal fracture. Disc levels: Moderate cervical spondylosis with discogenic disease greatest at the C5-6 level and left-sided facet arthropathy severe from C2 through C4. Left-sided C4-5 facet fusion. Upper chest: Calcific atherosclerosis of carotid bifurcations. Other: Negative. IMPRESSION: CT head: 1. Left frontal scalp contusion and laceration. No displaced calvarial fracture. 2. No acute intracranial abnormality. 3. Mild for age chronic microvascular ischemic changes and mild parenchymal volume loss of the brain. CT cervical spine: 1. Widening of C5-6 left facet and lateral subluxation of the right facet indicating facet capsular injury. 2. Mild anterior widening of the C5-6 intervertebral disc space and posterior displacement of the C5 spinous process  likely representing C5-6 disc and interspinous ligamentous injury. 3. Minimally displaced fractures of the left C5 lamina, base of right C5 pedicle, and left C4 transverse process. 4. Base of right C5 pedicle and left C4 transverse process fractures extend to foramen transversarium. Vertebral artery injury can be assessed with CT angiogram of the neck. 5. Mild prevertebral edema through level of fracture. No epidural hematoma identified. These results were called by telephone at the time of interpretation on 02/25/2017 at 9:13 pm to Dr. Sharen Heck , who verbally acknowledged these results. Electronically Signed   By: Mitzi Hansen M.D.   On: 02/25/2017 21:18   Dg Knee Complete 4 Views Right  Result Date: 02/25/2017 CLINICAL DATA:  83 year old female status post unwitnessed fall at home. EXAM: RIGHT KNEE - COMPLETE 4+ VIEW COMPARISON:  None. FINDINGS: Osteopenia. Small suprapatellar joint effusion on cross-table lateral view. Moderate to severe tricompartmental joint space loss. Patellofemoral and lateral compartment maximal degenerative spurring. No acute fracture or dislocation identified. IMPRESSION: 1. Small joint effusion suspected, but no acute fracture or dislocation identified about the right knee. 2. Advanced tricompartmental joint degeneration. Electronically Signed   By: Odessa Fleming M.D.   On: 02/25/2017 20:48    Procedures .Marland KitchenLaceration Repair Date/Time: 02/25/2017 11:12 PM Performed by: Liberty Handy Authorized by: Liberty Handy   Consent:    Consent obtained:  Verbal   Consent given by:  Patient   Risks discussed:  Pain and poor cosmetic result   Alternatives discussed:  Delayed treatment Anesthesia (see MAR for exact dosages):    Anesthesia method:  Local infiltration   Local anesthetic:  Lidocaine 1% WITH epi Laceration details:    Location:  Face   Face location:  Forehead Repair type:    Repair type:  Simple Pre-procedure details:    Preparation:  Patient  was prepped and draped in usual sterile fashion Exploration:    Hemostasis achieved with:  Epinephrine and direct pressure   Contaminated: no   Treatment:    Area cleansed with:  Saline   Amount of cleaning:  Standard   Irrigation solution:  Sterile saline   Irrigation method:  Tap   Visualized foreign bodies/material removed: no   Skin repair:    Repair method:  Sutures   Suture size:  5-0   Suture material:  Prolene   Suture technique:  Simple interrupted Approximation:    Approximation:  Close   Vermilion border: well-aligned   Post-procedure details:    Dressing:  Antibiotic ointment   Patient tolerance of procedure:  Tolerated well, no immediate complications   (including critical care time)  Medications Ordered in ED Medications  iopamidol (ISOVUE-370) 76 % injection 100 mL (not administered)  acetaminophen (TYLENOL) tablet 650 mg (650 mg Oral Given 02/25/17 2120)  lidocaine-EPINEPHrine (XYLOCAINE W/EPI) 2 %-1:200000 (PF) injection 20 mL (20 mLs Infiltration Given 02/25/17 2120)  Tdap (BOOSTRIX) injection 0.5 mL (0.5 mLs Intramuscular Given 02/25/17 2102)  morphine 4 MG/ML injection 4 mg (4 mg Intravenous Given 02/25/17 2303)     Initial Impression / Assessment and Plan / ED Course  I have reviewed the triage vital signs and the nursing notes.  Pertinent labs & imaging results that were available during my care of the patient were reviewed by me and considered in my medical decision making (see chart for details).  Clinical Course as of Feb 26 2351  Sat Feb 25, 2017  2203 1. Widening of C5-6 left facet and lateral subluxation of the right facet indicating facet capsular injury. 2. Mild anterior widening of the C5-6 intervertebral disc space and posterior displacement of the C5 spinous process likely representing C5-6 disc and interspinous ligamentous injury. 3. Minimally displaced fractures of the left C5 lamina, base of right C5 pedicle, and left C4 transverse process. 4.  Base of right C5 pedicle and left C4 transverse process fractures extend to foramen transversarium. Vertebral artery injury can be assessed with CT angiogram of the neck. 5. Mild prevertebral edema through level of fracture. No epidural hematoma identified. CT Cervical Spine Wo Contrast [CG]    Clinical Course User Index [CG] Liberty Handy, PA-C   83 year old female with history of Alzheimer's presents to the ED after mechanical fall. EMS was unable to place a cervical collar secondary to pain, she has a towel collar on. No CP, palpitations before fall. No recent illness. HPI limited due to underlying dementia. Will get imaging.   11PM: CT scan of head without acute injury. Right knee x-ray negative. CT scan of cervical spine shows significant injury. Patient was placed on Aspen collar with supervising physician. We'll get CT angio and consults neurosurgery. Repeat neurologic exam reassuring, she has intact strength and sensation to upper and lower extremities.   Final Clinical Impressions(s) / ED Diagnoses   Final diagnoses:  Fall, initial encounter  1111PM: Spoke to neurosurgery, Dr. Lovell Sheehan who reviewed patient's CT scan. Recommends Aspen collar and follow up in clinic within 1-2 weeks. Patient likely not a surgical candidate. CT angioma pending at shift change. We'll handoff to oncoming ED PA who will follow-up on CTA results and determine disposition.  Patient, ED treatment and discharge plan was discussed with supervising physician who also evaluated the patient and is agreeable with plan.   New Prescriptions New Prescriptions   No medications on file     Jerrell Mylar 02/25/17 2352    Shaune Pollack, MD 02/26/17 1226

## 2017-02-25 NOTE — Discharge Instructions (Addendum)
You were evaluated in the emergency department after fall.  Your forehead laceration was repaired with 4 sutures, these will need to be removed in 5 days. Keep clean, tap with clean water and apply any over the counter bacterial ointment. Monitor for signs of infection including redness, swelling, warmth, discharge, fever.  You have multiple fractures of your cervical spine. You need to wear your collar at all times until you are re-evaluated by neurosurgery (Dr. Lovell Sheehan). Call his office and make an appointment as soon as possible. Return to ED for weakness, numbness or decrease movement in extremities   Take 500 mg tylenol for mild/moderate pain.

## 2017-02-25 NOTE — ED Notes (Signed)
Bed: PQ24 Expected date:  Expected time:  Means of arrival:  Comments: 83 yo F/Fall

## 2017-02-25 NOTE — ED Triage Notes (Addendum)
Pt BIB [facility] from home after an unwitnessed fall. No LOC. Multiple skin tears noted: 3 on L arm and 1 on R. Laceration above L eyebrow and abrasion on scalp also visualized. A&Ox2, which is baseline per caregiver. Denies pain. Pt unable to tolerate c-collar with EMS, but towel roll applied. Daughter en route. No known blood thinners.

## 2017-02-25 NOTE — ED Notes (Signed)
Aspen P5 collar applied by Dr.Issacs.

## 2017-02-26 DIAGNOSIS — G8911 Acute pain due to trauma: Secondary | ICD-10-CM | POA: Diagnosis not present

## 2017-02-26 DIAGNOSIS — S129XXA Fracture of neck, unspecified, initial encounter: Secondary | ICD-10-CM | POA: Diagnosis not present

## 2017-02-26 DIAGNOSIS — R4182 Altered mental status, unspecified: Secondary | ICD-10-CM | POA: Diagnosis not present

## 2017-02-26 NOTE — ED Notes (Signed)
PT left with PTAR. Unable to sign d/t mental status

## 2017-02-26 NOTE — ED Provider Notes (Signed)
Care assumed from previous provider PA Gibbons. Please see their note for further details to include full history and physical. ase discussed, plan agreed upon. At time of care hand off pending CTA of the neck to rule out any arterial abnormalities. CTA returned and shows no acute vascular injury. Patient remains hemodynamically stable. Will be discharged home with close follow-up. Discussed return precautions with patient and family. Care dicussed with Dr. Erma Heritage who is agreeable to the above plan.        Rise Mu, PA-C 02/26/17 Geralyn Corwin    Shaune Pollack, MD 02/26/17 1226

## 2017-03-02 DIAGNOSIS — S129XXA Fracture of neck, unspecified, initial encounter: Secondary | ICD-10-CM | POA: Diagnosis not present

## 2017-03-02 DIAGNOSIS — W19XXXA Unspecified fall, initial encounter: Secondary | ICD-10-CM | POA: Diagnosis not present

## 2017-03-02 DIAGNOSIS — S0181XA Laceration without foreign body of other part of head, initial encounter: Secondary | ICD-10-CM | POA: Diagnosis not present

## 2017-03-06 ENCOUNTER — Ambulatory Visit: Payer: Medicare HMO | Admitting: Diagnostic Neuroimaging

## 2017-03-07 DIAGNOSIS — S12491A Other nondisplaced fracture of fifth cervical vertebra, initial encounter for closed fracture: Secondary | ICD-10-CM | POA: Diagnosis not present

## 2017-03-07 DIAGNOSIS — M542 Cervicalgia: Secondary | ICD-10-CM | POA: Diagnosis not present

## 2017-03-16 DIAGNOSIS — Z66 Do not resuscitate: Secondary | ICD-10-CM | POA: Diagnosis not present

## 2017-03-16 DIAGNOSIS — S129XXA Fracture of neck, unspecified, initial encounter: Secondary | ICD-10-CM | POA: Diagnosis not present

## 2017-03-16 DIAGNOSIS — Z7189 Other specified counseling: Secondary | ICD-10-CM | POA: Diagnosis not present

## 2017-04-17 DIAGNOSIS — L0201 Cutaneous abscess of face: Secondary | ICD-10-CM | POA: Diagnosis not present

## 2017-04-17 DIAGNOSIS — L03211 Cellulitis of face: Secondary | ICD-10-CM | POA: Diagnosis not present

## 2017-04-17 DIAGNOSIS — Z23 Encounter for immunization: Secondary | ICD-10-CM | POA: Diagnosis not present

## 2017-04-18 DIAGNOSIS — S12400D Unspecified displaced fracture of fifth cervical vertebra, subsequent encounter for fracture with routine healing: Secondary | ICD-10-CM | POA: Diagnosis not present

## 2017-04-18 DIAGNOSIS — Z681 Body mass index (BMI) 19 or less, adult: Secondary | ICD-10-CM | POA: Diagnosis not present

## 2017-04-18 DIAGNOSIS — S12491A Other nondisplaced fracture of fifth cervical vertebra, initial encounter for closed fracture: Secondary | ICD-10-CM | POA: Diagnosis not present

## 2017-05-10 DIAGNOSIS — R509 Fever, unspecified: Secondary | ICD-10-CM | POA: Diagnosis not present

## 2017-05-10 DIAGNOSIS — H6122 Impacted cerumen, left ear: Secondary | ICD-10-CM | POA: Diagnosis not present

## 2017-06-09 DIAGNOSIS — R899 Unspecified abnormal finding in specimens from other organs, systems and tissues: Secondary | ICD-10-CM | POA: Diagnosis not present

## 2017-06-09 DIAGNOSIS — R8290 Unspecified abnormal findings in urine: Secondary | ICD-10-CM | POA: Diagnosis not present

## 2017-06-27 ENCOUNTER — Ambulatory Visit: Payer: Medicare HMO | Admitting: Diagnostic Neuroimaging

## 2017-06-27 ENCOUNTER — Encounter: Payer: Self-pay | Admitting: Diagnostic Neuroimaging

## 2017-06-27 VITALS — BP 100/48 | HR 68 | Wt 90.8 lb

## 2017-06-27 DIAGNOSIS — F0391 Unspecified dementia with behavioral disturbance: Secondary | ICD-10-CM | POA: Diagnosis not present

## 2017-06-27 DIAGNOSIS — F03B18 Unspecified dementia, moderate, with other behavioral disturbance: Secondary | ICD-10-CM

## 2017-06-27 DIAGNOSIS — R269 Unspecified abnormalities of gait and mobility: Secondary | ICD-10-CM | POA: Diagnosis not present

## 2017-06-27 MED ORDER — SERTRALINE HCL 25 MG PO TABS: 25.0000 mg | ORAL_TABLET | Freq: Every day | ORAL | 4 refills | Status: DC

## 2017-06-27 MED ORDER — DONEPEZIL HCL 10 MG PO TABS: 10.0000 mg | ORAL_TABLET | Freq: Every day | ORAL | 4 refills | Status: DC

## 2017-06-27 MED ORDER — QUETIAPINE FUMARATE 25 MG PO TABS: 25.0000 mg | ORAL_TABLET | Freq: Every evening | ORAL | 4 refills | Status: AC | PRN

## 2017-06-27 NOTE — Patient Instructions (Signed)
  MODERATE DEMENTIA WITH BEHAVIOR CHANGES - continue 24 hour caregiver / aid - continue lorazepam 0.5mg  daily - continue seroquel 25mg  to as needed for severe agitation - continue sertraline 25mg  daily for mood stabilization  - continue rollator walker

## 2017-06-27 NOTE — Progress Notes (Signed)
[facility]  PATIENT: Savannah Garrison DOB: 20-Apr-1930  REFERRING CLINICIAN:  HISTORY FROM: patient, daughter REASON FOR VISIT: follow up    HISTORICAL  CHIEF COMPLAINT:  Chief Complaint  Patient presents with  . Dementia    rm 7, dgtrToniann Fail- Wendy  MMSE 11  . Follow-up    6 month    HISTORY OF PRESENT ILLNESS:   UPDATE (06/27/17, VRP): Since last visit, doing better. Tolerating sertraline. Behavior and mood are improved. No alleviating or aggravating factors.   UPDATE 11/21/16: Since last visit, more wandering behavior, angry outbursts, behavior changes. Now on ativan 0.5mg  daily and seroquel 25mg  at bedtime (x 1 week). These behaviors more significant since pt husband passed away in Jan 2018. Now pt has 24 hour caregiver.   UPDATE 05/18/15: Since last visit, continued decline. No longer driving. Minimal use of toaster. No more major cooking. Still light yard work. Memory loss is progression.  UPDATE 11/05/13 (VRP): Doing well. No new issues. Still cooks, cleans, cleans yard, drives a little. Tolerating donepezil.  UPDATE 05/03/13 (LL): since last visit, no significant change. Patient did not tolerate Namenda due to stomach upset, confusion. They have not followed up on B12 as requested. BP is much better in office today, 143/83. Patient has no complaints.  UPDATE 12/25/12: since last visit, patient has had progression of memory loss. This is noted by patient and her family. She is misplacing more objects in the home and kitchen. She is repeating herself more often. 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 patient is tolerating donepezil.   UPDATE 09/09/11: Memory loss has slightly progressed. Forgeting names of people. Repeats herself more. More problems in unfamiliar situations. Still drives without problems, although no one has been in the car with her when she is  driving. No probs with cooking.   PRIOR HPI (02/18/10): 83 year old right-handed female with no significant past medical history presenting for evaluation of memory difficulty over the past few months. He is accompanied by her husband at this visit.  Patient reports some difficulty in conversation where in mid sentence she forgets what word to say next. This has been intermittent over the past few months. She thinks it's been less than 6 months. She is concerned because her father and paternal uncle both were diagnosed with Alzheimer's disease in their 2070s. Patient has noticed the symptoms herself, but her husband and her daughter have noticed it more. Patient is not that bothered by her symptoms. She continues to drive, is able to shop, maintains household finances and also does cooking and other house chores.   REVIEW OF SYSTEMS: Full 14 system review of systems performed and negative except: depression anxiety memory loss incontinence.    ALLERGIES: No Known Allergies  HOME MEDICATIONS: Outpatient Medications Prior to Visit  Medication Sig Dispense Refill  . aspirin EC 81 MG tablet Take 81 mg by mouth daily.    Marland Kitchen. donepezil (ARICEPT) 10 MG tablet Take 1 tablet (10 mg total) by mouth at bedtime. 90 tablet 4  . LORazepam (ATIVAN) 0.5 MG tablet Take 0.5 mg by mouth every morning.    . naproxen sodium (ANAPROX) 220 MG tablet Take 220 mg by mouth 2 (two) times daily as needed (pain).    . QUEtiapine (SEROQUEL) 25 MG tablet Take 1 tablet (25 mg total) by mouth at bedtime as needed (agitation). 30 tablet 12  .  sertraline (ZOLOFT) 25 MG tablet Take 1 tablet (25 mg total) by mouth daily. 30 tablet 12   No facility-administered medications prior to visit.     PAST MEDICAL HISTORY: Past Medical History:  Diagnosis Date  . Alzheimer's disease   . History of bilateral cataract extraction   . Hyperlipidemia   . MCI (mild cognitive impairment)   . Osteoporosis   . Vitamin B12 deficiency   .  Vitamin D deficiency     PAST SURGICAL HISTORY: Past Surgical History:  Procedure Laterality Date  . CATARACT EXTRACTION    . DILATION AND CURETTAGE OF UTERUS    . TONSILLECTOMY AND ADENOIDECTOMY      FAMILY HISTORY: Family History  Problem Relation Age of Onset  . Lung cancer Mother   . Alzheimer's disease Father     SOCIAL HISTORY:  Social History   Socioeconomic History  . Marital status: Married    Spouse name: Aldrad  . Number of children: 2  . Years of education: College  . Highest education level: Not on file  Social Needs  . Financial resource strain: Not on file  . Food insecurity - worry: Not on file  . Food insecurity - inability: Not on file  . Transportation needs - medical: Not on file  . Transportation needs - non-medical: Not on file  Occupational History  . Occupation: Retired  Tobacco Use  . Smoking status: Never Smoker  . Smokeless tobacco: Never Used  Substance and Sexual Activity  . Alcohol use: No    Alcohol/week: 0.0 oz    Comment: 11/21/16 none per dgtr  . Drug use: No  . Sexual activity: Not on file  Other Topics Concern  . Not on file  Social History Narrative   11/21/16 Pt lives at home with 24 hr caregiver.   Has been married for 53+ years.   Education [employer].   Right handed.   Caffeine Use: 1-2 glasses daily (green tea)     PHYSICAL EXAM  Vitals:   06/27/17 0928  BP: (!) 100/48  Pulse: 68  Weight: 90 lb 12.8 oz (41.2 kg)    Not recorded     Wt Readings from Last 3 Encounters:  06/27/17 90 lb 12.8 oz (41.2 kg)  11/21/16 104 lb 3.2 oz (47.3 kg)  08/02/16 120 lb (54.4 kg)   Body mass index is 17.16 kg/m.  MMSE - Mini Mental State Exam 06/27/2017 11/21/2016 05/18/2015  Orientation to time 1 0 1  Orientation to Place 3 1 2   Registration 3 3 3   Attention/ Calculation 0 1 1  Recall 0 0 0  Language- name 2 objects 1 2 2   Language- repeat 0 1 0  Language- follow 3 step command 3 3 3   Language- read & follow direction  0 1 1  Write a sentence 0 1 0  Copy design 0 0 0  Total score 11 13 13     GENERAL EXAM:  Patient is in no distress   CARDIOVASCULAR:  Regular rate and rhythm, no murmurs, no carotid bruits   NEUROLOGIC:  MENTAL STATUS: awake, alert, language fluent, comprehension intact, naming intact; POSITIVE MYERSONS AND PALMOMENTAL.  CRANIAL NERVE: pupils equal and reactive to light, visual fields full to confrontation, extraocular muscles intact, no nystagmus, facial sensation and strength symmetric, uvula midline, shoulder shrug symmetric, tongue midline.  MOTOR: normal bulk and tone, full strength in the BUE, BLE; MODERATE BRADYKINESIA IN BUE AND BLE  SENSORY: normal and symmetric to light touch, temperature,  vibration  COORDINATION: finger-nose-finger, fine finger movements normal  REFLEXES: deep tendon reflexes present and symmetric  GAIT/STATION: SLOW ANTALGIC GAIT. USES ROLLATOR WALKER.     DIAGNOSTIC DATA (LABS, IMAGING, TESTING) - I reviewed patient records, labs, notes, testing and imaging myself where available.  Lab Results  Component Value Date   WBC 10.7 (H) 02/25/2017   HGB 11.6 (L) 02/25/2017   HCT 35.4 (L) 02/25/2017   MCV 88.3 02/25/2017   PLT 196 02/25/2017      Component Value Date/Time   NA 144 02/25/2017 2224   K 3.8 02/25/2017 2224   CL 105 02/25/2017 2224   CO2 31 02/25/2017 2224   GLUCOSE 131 (H) 02/25/2017 2224   BUN 15 02/25/2017 2224   CREATININE 0.82 02/25/2017 2224   CALCIUM 9.0 02/25/2017 2224   PROT 6.1 (L) 06/02/2015 0435   ALBUMIN 3.4 (L) 06/02/2015 0435   AST 27 06/02/2015 0435   ALT 13 (L) 06/02/2015 0435   ALKPHOS 73 06/02/2015 0435   BILITOT 0.8 06/02/2015 0435   GFRNONAA >60 02/25/2017 2224   GFRAA >60 02/25/2017 2224   Lab Results  Component Value Date   CHOL 185 06/02/2015   No results found for: HGBA1C No results found for: VITAMINB12 No results found for: TSH   10/07/10  B12 - 138 (L)  TSH 0.64  RPR NR   09/23/11 MRI brain  - mild chronic microvascular ischemia and supratentorial cortical atrophy    ASSESSMENT AND PLAN  83 y.o. year old female here with moderate dementia (progressive short-term memory loss, progressive loss of ADLs). Now with some behavior and agitation issues.   MMSE 02/18/10 - 25 09/09/11 - 22 12/25/12 - 22 05/03/13 - 23 11/05/13 - 22 11/11/14 - 18 05/18/15 - 13 11/21/16 - 13 06/27/17 - 11  Dx:  Moderate dementia with behavioral disturbance  Gait difficulty    PLAN:   MODERATE DEMENTIA WITH BEHAVIOR CHANGES - continue 24 hour caregiver / aid - continue lorazepam 0.5mg  daily - continue seroquel 25mg  to as needed for severe agitation - continue sertraline 25mg  daily for mood stabilization  - continue rollator walker; patient is high fall risk  Meds ordered this encounter  Medications  . sertraline (ZOLOFT) 25 MG tablet    Sig: Take 1 tablet (25 mg total) by mouth daily.    Dispense:  90 tablet    Refill:  4  . QUEtiapine (SEROQUEL) 25 MG tablet    Sig: Take 1 tablet (25 mg total) by mouth at bedtime as needed (agitation).    Dispense:  90 tablet    Refill:  4  . donepezil (ARICEPT) 10 MG tablet    Sig: Take 1 tablet (10 mg total) by mouth at bedtime.    Dispense:  90 tablet    Refill:  4   Return if symptoms worsen or fail to improve, for return to PCP. may return here as needed    Suanne Marker, MD 06/27/2017, 10:07 AM Certified in Neurology, Neurophysiology and Neuroimaging  [facility] [address], [address] [place], [place] [address] [phone]

## 2017-08-15 DIAGNOSIS — R300 Dysuria: Secondary | ICD-10-CM | POA: Diagnosis not present

## 2017-08-15 DIAGNOSIS — M810 Age-related osteoporosis without current pathological fracture: Secondary | ICD-10-CM | POA: Diagnosis not present

## 2017-08-15 DIAGNOSIS — N390 Urinary tract infection, site not specified: Secondary | ICD-10-CM | POA: Diagnosis not present

## 2017-08-15 DIAGNOSIS — E7800 Pure hypercholesterolemia, unspecified: Secondary | ICD-10-CM | POA: Diagnosis not present

## 2017-08-15 DIAGNOSIS — E538 Deficiency of other specified B group vitamins: Secondary | ICD-10-CM | POA: Diagnosis not present

## 2017-08-15 DIAGNOSIS — I10 Essential (primary) hypertension: Secondary | ICD-10-CM | POA: Diagnosis not present

## 2017-08-15 DIAGNOSIS — R451 Restlessness and agitation: Secondary | ICD-10-CM | POA: Diagnosis not present

## 2017-08-15 DIAGNOSIS — R413 Other amnesia: Secondary | ICD-10-CM | POA: Diagnosis not present

## 2017-08-15 DIAGNOSIS — Z7409 Other reduced mobility: Secondary | ICD-10-CM | POA: Diagnosis not present

## 2017-09-14 DIAGNOSIS — R413 Other amnesia: Secondary | ICD-10-CM | POA: Diagnosis not present

## 2017-09-14 DIAGNOSIS — K5900 Constipation, unspecified: Secondary | ICD-10-CM | POA: Diagnosis not present

## 2017-10-30 DIAGNOSIS — N390 Urinary tract infection, site not specified: Secondary | ICD-10-CM | POA: Diagnosis not present

## 2017-10-30 DIAGNOSIS — R4189 Other symptoms and signs involving cognitive functions and awareness: Secondary | ICD-10-CM | POA: Diagnosis not present

## 2017-12-03 ENCOUNTER — Other Ambulatory Visit: Payer: Self-pay | Admitting: Diagnostic Neuroimaging

## 2017-12-13 ENCOUNTER — Telehealth: Payer: Self-pay | Admitting: *Deleted

## 2017-12-13 NOTE — Telephone Encounter (Signed)
PA approved for seroquel 25mg  tablets.  Good until 12-13-19 [employer] ID [id number]  [phone].  (PA initiated on CoverMy Meds).  Relayed to [facility] [id number].

## 2018-02-08 DIAGNOSIS — N390 Urinary tract infection, site not specified: Secondary | ICD-10-CM | POA: Diagnosis not present

## 2018-02-08 DIAGNOSIS — R413 Other amnesia: Secondary | ICD-10-CM | POA: Diagnosis not present

## 2018-02-08 DIAGNOSIS — R351 Nocturia: Secondary | ICD-10-CM | POA: Diagnosis not present

## 2018-03-18 DIAGNOSIS — F0390 Unspecified dementia without behavioral disturbance: Secondary | ICD-10-CM | POA: Diagnosis not present

## 2018-03-18 DIAGNOSIS — N390 Urinary tract infection, site not specified: Secondary | ICD-10-CM | POA: Diagnosis not present

## 2018-04-20 DIAGNOSIS — Z23 Encounter for immunization: Secondary | ICD-10-CM | POA: Diagnosis not present

## 2018-04-20 DIAGNOSIS — E7800 Pure hypercholesterolemia, unspecified: Secondary | ICD-10-CM | POA: Diagnosis not present

## 2018-04-20 DIAGNOSIS — M810 Age-related osteoporosis without current pathological fracture: Secondary | ICD-10-CM | POA: Diagnosis not present

## 2018-04-20 DIAGNOSIS — E538 Deficiency of other specified B group vitamins: Secondary | ICD-10-CM | POA: Diagnosis not present

## 2018-04-20 DIAGNOSIS — H6123 Impacted cerumen, bilateral: Secondary | ICD-10-CM | POA: Diagnosis not present

## 2018-06-27 DIAGNOSIS — R4189 Other symptoms and signs involving cognitive functions and awareness: Secondary | ICD-10-CM | POA: Diagnosis not present

## 2018-07-31 DIAGNOSIS — N390 Urinary tract infection, site not specified: Secondary | ICD-10-CM | POA: Diagnosis not present

## 2018-07-31 DIAGNOSIS — J069 Acute upper respiratory infection, unspecified: Secondary | ICD-10-CM | POA: Diagnosis not present

## 2018-08-27 ENCOUNTER — Other Ambulatory Visit: Payer: Self-pay | Admitting: Diagnostic Neuroimaging

## 2018-08-31 DIAGNOSIS — N390 Urinary tract infection, site not specified: Secondary | ICD-10-CM | POA: Diagnosis not present

## 2018-09-14 ENCOUNTER — Other Ambulatory Visit: Payer: Self-pay | Admitting: Diagnostic Neuroimaging

## 2018-09-21 ENCOUNTER — Other Ambulatory Visit: Payer: Self-pay | Admitting: Diagnostic Neuroimaging

## 2018-09-25 ENCOUNTER — Other Ambulatory Visit: Payer: Self-pay | Admitting: Diagnostic Neuroimaging

## 2018-09-26 ENCOUNTER — Other Ambulatory Visit: Payer: Self-pay | Admitting: Diagnostic Neuroimaging

## 2018-09-27 DIAGNOSIS — R413 Other amnesia: Secondary | ICD-10-CM | POA: Diagnosis not present

## 2018-09-27 DIAGNOSIS — E7800 Pure hypercholesterolemia, unspecified: Secondary | ICD-10-CM | POA: Diagnosis not present

## 2018-09-27 DIAGNOSIS — N390 Urinary tract infection, site not specified: Secondary | ICD-10-CM | POA: Diagnosis not present

## 2018-09-27 DIAGNOSIS — E538 Deficiency of other specified B group vitamins: Secondary | ICD-10-CM | POA: Diagnosis not present

## 2018-09-27 DIAGNOSIS — M810 Age-related osteoporosis without current pathological fracture: Secondary | ICD-10-CM | POA: Diagnosis not present

## 2018-09-27 DIAGNOSIS — F0390 Unspecified dementia without behavioral disturbance: Secondary | ICD-10-CM | POA: Diagnosis not present

## 2018-10-15 DIAGNOSIS — R300 Dysuria: Secondary | ICD-10-CM | POA: Diagnosis not present

## 2018-10-15 DIAGNOSIS — R8290 Unspecified abnormal findings in urine: Secondary | ICD-10-CM | POA: Diagnosis not present

## 2018-10-15 DIAGNOSIS — R740 Nonspecific elevation of levels of transaminase and lactic acid dehydrogenase [LDH]: Secondary | ICD-10-CM | POA: Diagnosis not present

## 2018-11-06 DIAGNOSIS — N390 Urinary tract infection, site not specified: Secondary | ICD-10-CM | POA: Diagnosis not present

## 2019-01-04 DIAGNOSIS — N390 Urinary tract infection, site not specified: Secondary | ICD-10-CM | POA: Diagnosis not present

## 2019-01-04 DIAGNOSIS — R413 Other amnesia: Secondary | ICD-10-CM | POA: Diagnosis not present

## 2019-01-04 DIAGNOSIS — R451 Restlessness and agitation: Secondary | ICD-10-CM | POA: Diagnosis not present

## 2019-01-28 ENCOUNTER — Emergency Department (HOSPITAL_COMMUNITY): Payer: Medicare HMO

## 2019-01-28 ENCOUNTER — Other Ambulatory Visit: Payer: Self-pay

## 2019-01-28 ENCOUNTER — Emergency Department (HOSPITAL_COMMUNITY)
Admission: EM | Admit: 2019-01-28 | Discharge: 2019-01-28 | Disposition: A | Discharge status: Home / Self Care | Payer: Medicare HMO | Attending: Emergency Medicine | Admitting: Emergency Medicine

## 2019-01-28 ENCOUNTER — Encounter (HOSPITAL_COMMUNITY): Payer: Self-pay

## 2019-01-28 DIAGNOSIS — Z209 Contact with and (suspected) exposure to unspecified communicable disease: Secondary | ICD-10-CM | POA: Diagnosis not present

## 2019-01-28 DIAGNOSIS — F0280 Dementia in other diseases classified elsewhere without behavioral disturbance: Secondary | ICD-10-CM | POA: Diagnosis not present

## 2019-01-28 DIAGNOSIS — R509 Fever, unspecified: Secondary | ICD-10-CM | POA: Diagnosis not present

## 2019-01-28 DIAGNOSIS — Z03818 Encounter for observation for suspected exposure to other biological agents ruled out: Secondary | ICD-10-CM | POA: Diagnosis not present

## 2019-01-28 DIAGNOSIS — Z20828 Contact with and (suspected) exposure to other viral communicable diseases: Secondary | ICD-10-CM | POA: Diagnosis not present

## 2019-01-28 DIAGNOSIS — Z7982 Long term (current) use of aspirin: Secondary | ICD-10-CM | POA: Insufficient documentation

## 2019-01-28 DIAGNOSIS — F0390 Unspecified dementia without behavioral disturbance: Secondary | ICD-10-CM

## 2019-01-28 DIAGNOSIS — Z8673 Personal history of transient ischemic attack (TIA), and cerebral infarction without residual deficits: Secondary | ICD-10-CM | POA: Insufficient documentation

## 2019-01-28 DIAGNOSIS — Z79899 Other long term (current) drug therapy: Secondary | ICD-10-CM | POA: Diagnosis not present

## 2019-01-28 DIAGNOSIS — I700 Atherosclerosis of aorta: Secondary | ICD-10-CM | POA: Diagnosis not present

## 2019-01-28 DIAGNOSIS — R5381 Other malaise: Secondary | ICD-10-CM | POA: Diagnosis not present

## 2019-01-28 DIAGNOSIS — R82998 Other abnormal findings in urine: Secondary | ICD-10-CM | POA: Diagnosis present

## 2019-01-28 DIAGNOSIS — R410 Disorientation, unspecified: Secondary | ICD-10-CM | POA: Diagnosis not present

## 2019-01-28 DIAGNOSIS — G309 Alzheimer's disease, unspecified: Secondary | ICD-10-CM | POA: Diagnosis not present

## 2019-01-28 DIAGNOSIS — M2550 Pain in unspecified joint: Secondary | ICD-10-CM | POA: Diagnosis not present

## 2019-01-28 DIAGNOSIS — Z7401 Bed confinement status: Secondary | ICD-10-CM | POA: Diagnosis not present

## 2019-01-28 DIAGNOSIS — R300 Dysuria: Secondary | ICD-10-CM | POA: Diagnosis not present

## 2019-01-28 LAB — LACTIC ACID, PLASMA: Lactic Acid, Venous: 0.9 mmol/L (ref 0.5–1.9)

## 2019-01-28 LAB — URINALYSIS, ROUTINE W REFLEX MICROSCOPIC
Bilirubin Urine: NEGATIVE
Glucose, UA: NEGATIVE mg/dL
Hgb urine dipstick: NEGATIVE
Ketones, ur: NEGATIVE mg/dL
Nitrite: NEGATIVE
Protein, ur: NEGATIVE mg/dL
Specific Gravity, Urine: 1.011 (ref 1.005–1.030)
pH: 6 (ref 5.0–8.0)

## 2019-01-28 LAB — CBC WITH DIFFERENTIAL/PLATELET
Abs Immature Granulocytes: 0.02 10*3/uL (ref 0.00–0.07)
Basophils Absolute: 0.1 10*3/uL (ref 0.0–0.1)
Basophils Relative: 1 %
Eosinophils Absolute: 2.2 10*3/uL — ABNORMAL HIGH (ref 0.0–0.5)
Eosinophils Relative: 25 %
HCT: 35 % — ABNORMAL LOW (ref 36.0–46.0)
Hemoglobin: 11 g/dL — ABNORMAL LOW (ref 12.0–15.0)
Immature Granulocytes: 0 %
Lymphocytes Relative: 7 %
Lymphs Abs: 0.6 10*3/uL — ABNORMAL LOW (ref 0.7–4.0)
MCH: 30.1 pg (ref 26.0–34.0)
MCHC: 31.4 g/dL (ref 30.0–36.0)
MCV: 95.9 fL (ref 80.0–100.0)
Monocytes Absolute: 0.6 10*3/uL (ref 0.1–1.0)
Monocytes Relative: 6 %
Neutro Abs: 5.5 10*3/uL (ref 1.7–7.7)
Neutrophils Relative %: 61 %
Platelets: 224 10*3/uL (ref 150–400)
RBC: 3.65 MIL/uL — ABNORMAL LOW (ref 3.87–5.11)
RDW: 14.7 % (ref 11.5–15.5)
WBC: 8.9 10*3/uL (ref 4.0–10.5)
nRBC: 0 % (ref 0.0–0.2)

## 2019-01-28 LAB — COMPREHENSIVE METABOLIC PANEL
ALT: 21 U/L (ref 0–44)
AST: 26 U/L (ref 15–41)
Albumin: 2.7 g/dL — ABNORMAL LOW (ref 3.5–5.0)
Alkaline Phosphatase: 96 U/L (ref 38–126)
Anion gap: 8 (ref 5–15)
BUN: 31 mg/dL — ABNORMAL HIGH (ref 8–23)
CO2: 26 mmol/L (ref 22–32)
Calcium: 8.9 mg/dL (ref 8.9–10.3)
Chloride: 104 mmol/L (ref 98–111)
Creatinine, Ser: 0.98 mg/dL (ref 0.44–1.00)
GFR calc Af Amer: >60 mL/min (ref >60)
GFR calc non Af Amer: 52 mL/min — ABNORMAL LOW (ref >60)
Glucose, Bld: 83 mg/dL (ref 70–99)
Potassium: 4.1 mmol/L (ref 3.5–5.1)
Sodium: 138 mmol/L (ref 135–145)
Total Bilirubin: 0.4 mg/dL (ref 0.3–1.2)
Total Protein: 5.5 g/dL — ABNORMAL LOW (ref 6.5–8.1)

## 2019-01-28 LAB — SARS CORONAVIRUS 2 BY RT PCR (HOSPITAL ORDER, PERFORMED IN ~~LOC~~ HOSPITAL LAB): SARS Coronavirus 2: NEGATIVE

## 2019-01-28 MED ORDER — LORAZEPAM 2 MG/ML IJ SOLN
0.5000 mg | Freq: Once | INTRAMUSCULAR | Status: AC
  Administered 2019-01-28: 19:00:00 0.5 mg via INTRAVENOUS
  Filled 2019-01-28: qty 1

## 2019-01-28 NOTE — ED Notes (Signed)
PTAR called , Pt dtr updated on pt disposition.

## 2019-01-28 NOTE — ED Notes (Signed)
Bed: WA04 Expected date:  Expected time:  Means of arrival:  Comments: EMS UTI/fever earlier

## 2019-01-28 NOTE — Discharge Instructions (Addendum)
Please return for any problem.  Follow-up with your regular care provider as instructed. °

## 2019-01-28 NOTE — ED Provider Notes (Signed)
[facility]-EMERGENCY DEPT Provider Note   CSN: [id number] Arrival date & time: 01/28/19  1703     History   Chief Complaint Chief Complaint  Patient presents with  . Recurrent UTI    HPI Rosaria FerriesBarbara S Ketner is a 85 y.o. female.     85 year old female with prior medical history as detailed below presents for evaluation of recurrent fever.  Patient appears to be comfortable at time of my initial evaluation.  She does have a history of dementia and is unable to provide significant history.  Patient's daughter reports via phone conversation that the patient has had intermittent fevers up to 102 over the last 7 to 10 days.  She is currently on amoxicillin for treatment of a possible dental infection.  She does have a longstanding history of frequent and recurrent UTIs.  Her last documented UTI was approximately 1 month ago.  She was treated at that time with outpatient antibiotics.  Her last admission was approximately 2 years prior.  Of note, patient is a DNR per report of the daughter.  The history is provided by the patient, a relative and medical records.  Fever Max temp prior to arrival:  102 Temp source:  Subjective Severity:  Moderate Onset quality:  Gradual Duration:  7 days Timing:  Constant Progression:  Waxing and waning Chronicity:  New Relieved by:  Nothing Worsened by:  Nothing   Past Medical History:  Diagnosis Date  . Alzheimer's disease (HCC)   . History of bilateral cataract extraction   . Hyperlipidemia   . MCI (mild cognitive impairment)   . Osteoporosis   . Vitamin B12 deficiency   . Vitamin D deficiency     Patient Active Problem List   Diagnosis Date Noted  . History of CVA (cerebrovascular accident) 06/01/2015  . UTI (urinary tract infection) 06/01/2015  . Encephalopathy, hypertensive 06/01/2015  . Dizziness 05/31/2015  . Elevated troponin 05/31/2015  . Hypertensive urgency 05/31/2015  . Moderate dementia with behavioral  disturbance (HCC) 11/05/2013  . Memory loss 11/05/2013    Past Surgical History:  Procedure Laterality Date  . CATARACT EXTRACTION    . DILATION AND CURETTAGE OF UTERUS    . TONSILLECTOMY AND ADENOIDECTOMY       OB History   No obstetric history on file.      Home Medications    Prior to Admission medications   Medication Sig Start Date End Date Taking? Authorizing Provider  aspirin EC 81 MG tablet Take 81 mg by mouth daily.    [provider]  donepezil (ARICEPT) 10 MG tablet Take 1 tablet (10 mg total) by mouth at bedtime. 06/27/17   Penumalli, Glenford BayleyVikram R, MD  LORazepam (ATIVAN) 0.5 MG tablet Take 0.5 mg by mouth every morning. 06/03/16   [provider]  naproxen sodium (ANAPROX) 220 MG tablet Take 220 mg by mouth 2 (two) times daily as needed (pain).    [provider]  QUEtiapine (SEROQUEL) 25 MG tablet Take 1 tablet (25 mg total) by mouth at bedtime as needed (agitation). 06/27/17   Penumalli, Glenford BayleyVikram R, MD  sertraline (ZOLOFT) 25 MG tablet Take 1 tablet (25 mg total) by mouth daily. 06/27/17   Penumalli, Glenford BayleyVikram R, MD    Family History Family History  Problem Relation Age of Onset  . Lung cancer Mother   . Alzheimer's disease Father     Social History Social History   Tobacco Use  . Smoking status: Never Smoker  . Smokeless tobacco:  Never Used  Substance Use Topics  . Alcohol use: No    Alcohol/week: 0.0 standard drinks    Comment: 11/21/16 none per dgtr  . Drug use: No     Allergies   Patient has no known allergies.   Review of Systems Review of Systems  Constitutional: Positive for fever.  All other systems reviewed and are negative.    Physical Exam Updated Vital Signs BP (!) 105/59 (BP Location: Left Arm)   Pulse 74   Temp 97.6 F (36.4 C) (Oral)   Resp 18   SpO2 100%   Physical Exam Vitals signs and nursing note reviewed.  Constitutional:      General: She is not in acute distress.    Appearance: Normal  appearance. She is well-developed.  HENT:     Head: Normocephalic and atraumatic.  Eyes:     Conjunctiva/sclera: Conjunctivae normal.     Pupils: Pupils are equal, round, and reactive to light.  Neck:     Musculoskeletal: Normal range of motion and neck supple.  Cardiovascular:     Rate and Rhythm: Normal rate and regular rhythm.     Heart sounds: Normal heart sounds.  Pulmonary:     Effort: Pulmonary effort is normal. No respiratory distress.     Breath sounds: Normal breath sounds.  Abdominal:     General: There is no distension.     Palpations: Abdomen is soft.     Tenderness: There is no abdominal tenderness.  Musculoskeletal: Normal range of motion.        General: No deformity.  Skin:    General: Skin is warm and dry.  Neurological:     General: No focal deficit present.     Mental Status: She is alert. Mental status is at baseline. She is disoriented.      ED Treatments / Results  Labs (all labs ordered are listed, but only abnormal results are displayed) Labs Reviewed  URINALYSIS, ROUTINE W REFLEX MICROSCOPIC - Abnormal; Notable for the following components:      Result Value   Leukocytes,Ua TRACE (*)    Bacteria, UA RARE (*)    All other components within normal limits  COMPREHENSIVE METABOLIC PANEL - Abnormal; Notable for the following components:   BUN 31 (*)    Total Protein 5.5 (*)    Albumin 2.7 (*)    GFR calc non Af Amer 52 (*)    All other components within normal limits  CBC WITH DIFFERENTIAL/PLATELET - Abnormal; Notable for the following components:   RBC 3.65 (*)    Hemoglobin 11.0 (*)    HCT 35.0 (*)    Lymphs Abs 0.6 (*)    Eosinophils Absolute 2.2 (*)    All other components within normal limits  SARS CORONAVIRUS 2 (HOSPITAL ORDER, [facility])  CULTURE, BLOOD (ROUTINE X 2)  CULTURE, BLOOD (ROUTINE X 2)  URINE CULTURE  LACTIC ACID, PLASMA  LACTIC ACID, PLASMA    EKG None  Radiology Dg Chest Port 1 View   Result Date: 01/28/2019 CLINICAL DATA:  Recurring UTIs. EXAM: PORTABLE CHEST 1 VIEW COMPARISON:  05/31/2015 FINDINGS: Cardiomediastinal silhouette is normal. Mediastinal contours appear intact. Tortuosity and calcific atherosclerotic disease of the aorta. There is no evidence of focal airspace consolidation, pleural effusion or pneumothorax. Osseous structures are without acute abnormality. Soft tissues are grossly normal. IMPRESSION: No active disease. Electronically Signed   By: Fidela Salisbury M.D.   On: 01/28/2019 17:57    Procedures  Procedures (including critical care time)  Medications Ordered in ED Medications - No data to display   Initial Impression / Assessment and Plan / ED Course  I have reviewed the triage vital signs and the nursing notes.  Pertinent labs & imaging results that were available during my care of the patient were reviewed by me and considered in my medical decision making (see chart for details).        MDM  Screen complete  MARLAYSIA SENDEJO was evaluated in Emergency Department on 01/28/2019 for the symptoms described in the history of present illness. She was evaluated in the context of the global COVID-19 pandemic, which necessitated consideration that the patient might be at risk for infection with the SARS-CoV-2 virus that causes COVID-19. Institutional protocols and algorithms that pertain to the evaluation of patients at risk for COVID-19 are in a state of rapid change based on information released by regulatory bodies including the CDC and federal and state organizations. These policies and algorithms were followed during the patient's care in the ED.  Patient is arriving from home for evaluation of possible UTI.  Patient with reported intermittent fevers over the last week.  Evaluation today does not demonstrate evidence of fever.  Work-up did not demonstrate leukocytosis.  UA is essentially unremarkable.  Chest x-ray is clear.  Other screening labs  obtained including a COVID screen are negative for significant acute pathology.  Patient's case extensively discussed with her daughter -Reinaldo Meeker.  Given the lack of significant findings on the work-up I feel that discharge is appropriate.  Patient daughter understands and is in agreement with this plan.  Ms. Katrinka Blazing was offered admission for her mother.  She declined same.  She is concerned that her mother could develop or be exposed to COVID during her inpatient stay.  I think that this is an appropriate concern.  Patient's daughter does understand the need for close follow-up with the patient's primary care provider.  Strict return precautions given and understood.  Importance of close follow-up is repeatedly stressed.  Final Clinical Impressions(s) / ED Diagnoses   Final diagnoses:  Dementia without behavioral disturbance, unspecified dementia type [facility])    ED Discharge Orders    None       Wynetta Fines, MD 01/28/19 2107

## 2019-01-28 NOTE — ED Triage Notes (Signed)
Pt arrived via EMS from home. Pt is c/o foul smelling in urine, with some associated lower mild abdominal pain, and fever at home per CG temp was 100.6, pt was recently treated for UTI 2 weeks ago. Pt has Hx of dementia as well as recurrent   UTI's.    EMS v/s 105/64, HR 70, RR 18, 99% RA

## 2019-01-29 LAB — URINE CULTURE: Culture: NO GROWTH

## 2019-02-02 LAB — CULTURE, BLOOD (ROUTINE X 2)
Culture: NO GROWTH
Culture: NO GROWTH
Special Requests: ADEQUATE
Special Requests: ADEQUATE

## 2019-02-08 ENCOUNTER — Other Ambulatory Visit: Payer: Self-pay | Admitting: Family Medicine

## 2019-02-08 ENCOUNTER — Ambulatory Visit
Admission: RE | Admit: 2019-02-08 | Discharge: 2019-02-08 | Disposition: A | Discharge status: Home / Self Care | Payer: Medicare HMO | Source: Ambulatory Visit | Attending: Family Medicine | Admitting: Family Medicine

## 2019-02-08 DIAGNOSIS — M7989 Other specified soft tissue disorders: Secondary | ICD-10-CM | POA: Diagnosis not present

## 2019-02-08 DIAGNOSIS — R600 Localized edema: Secondary | ICD-10-CM

## 2019-02-08 DIAGNOSIS — M25472 Effusion, left ankle: Secondary | ICD-10-CM | POA: Diagnosis not present

## 2019-03-08 DIAGNOSIS — N3000 Acute cystitis without hematuria: Secondary | ICD-10-CM | POA: Diagnosis not present

## 2019-03-08 DIAGNOSIS — R8290 Unspecified abnormal findings in urine: Secondary | ICD-10-CM | POA: Diagnosis not present

## 2019-04-02 DIAGNOSIS — R634 Abnormal weight loss: Secondary | ICD-10-CM | POA: Diagnosis not present

## 2019-04-02 DIAGNOSIS — F0390 Unspecified dementia without behavioral disturbance: Secondary | ICD-10-CM | POA: Diagnosis not present

## 2019-04-02 DIAGNOSIS — N390 Urinary tract infection, site not specified: Secondary | ICD-10-CM | POA: Diagnosis not present

## 2019-04-02 DIAGNOSIS — R451 Restlessness and agitation: Secondary | ICD-10-CM | POA: Diagnosis not present

## 2019-04-02 DIAGNOSIS — R627 Adult failure to thrive: Secondary | ICD-10-CM | POA: Diagnosis not present

## 2019-04-02 DIAGNOSIS — R399 Unspecified symptoms and signs involving the genitourinary system: Secondary | ICD-10-CM | POA: Diagnosis not present

## 2019-04-02 DIAGNOSIS — Z7409 Other reduced mobility: Secondary | ICD-10-CM | POA: Diagnosis not present

## 2019-05-21 DIAGNOSIS — I10 Essential (primary) hypertension: Secondary | ICD-10-CM | POA: Diagnosis not present

## 2019-05-21 DIAGNOSIS — F0390 Unspecified dementia without behavioral disturbance: Secondary | ICD-10-CM | POA: Diagnosis not present

## 2019-05-21 DIAGNOSIS — E7800 Pure hypercholesterolemia, unspecified: Secondary | ICD-10-CM | POA: Diagnosis not present

## 2019-05-21 DIAGNOSIS — M810 Age-related osteoporosis without current pathological fracture: Secondary | ICD-10-CM | POA: Diagnosis not present

## 2019-05-27 DIAGNOSIS — R399 Unspecified symptoms and signs involving the genitourinary system: Secondary | ICD-10-CM | POA: Diagnosis not present

## 2019-06-11 DIAGNOSIS — R410 Disorientation, unspecified: Secondary | ICD-10-CM | POA: Diagnosis not present

## 2019-06-14 ENCOUNTER — Emergency Department (HOSPITAL_COMMUNITY): Payer: Medicare HMO

## 2019-06-14 ENCOUNTER — Emergency Department (HOSPITAL_COMMUNITY)
Admission: EM | Admit: 2019-06-14 | Discharge: 2019-06-15 | Disposition: A | Discharge status: Home / Self Care | Payer: Medicare HMO | Attending: Emergency Medicine | Admitting: Emergency Medicine

## 2019-06-14 DIAGNOSIS — Z8673 Personal history of transient ischemic attack (TIA), and cerebral infarction without residual deficits: Secondary | ICD-10-CM | POA: Insufficient documentation

## 2019-06-14 DIAGNOSIS — F0281 Dementia in other diseases classified elsewhere with behavioral disturbance: Secondary | ICD-10-CM | POA: Insufficient documentation

## 2019-06-14 DIAGNOSIS — G309 Alzheimer's disease, unspecified: Secondary | ICD-10-CM | POA: Diagnosis not present

## 2019-06-14 DIAGNOSIS — N390 Urinary tract infection, site not specified: Secondary | ICD-10-CM

## 2019-06-14 DIAGNOSIS — R410 Disorientation, unspecified: Secondary | ICD-10-CM | POA: Diagnosis not present

## 2019-06-14 DIAGNOSIS — R4182 Altered mental status, unspecified: Secondary | ICD-10-CM | POA: Diagnosis present

## 2019-06-14 DIAGNOSIS — Z7982 Long term (current) use of aspirin: Secondary | ICD-10-CM | POA: Diagnosis not present

## 2019-06-14 DIAGNOSIS — R52 Pain, unspecified: Secondary | ICD-10-CM | POA: Diagnosis not present

## 2019-06-14 DIAGNOSIS — Z79899 Other long term (current) drug therapy: Secondary | ICD-10-CM | POA: Diagnosis not present

## 2019-06-14 DIAGNOSIS — R443 Hallucinations, unspecified: Secondary | ICD-10-CM | POA: Diagnosis not present

## 2019-06-14 LAB — COMPREHENSIVE METABOLIC PANEL
ALT: 19 U/L (ref 0–44)
AST: 25 U/L (ref 15–41)
Albumin: 2.9 g/dL — ABNORMAL LOW (ref 3.5–5.0)
Alkaline Phosphatase: 93 U/L (ref 38–126)
Anion gap: 7 (ref 5–15)
BUN: 18 mg/dL (ref 8–23)
CO2: 27 mmol/L (ref 22–32)
Calcium: 8.5 mg/dL — ABNORMAL LOW (ref 8.9–10.3)
Chloride: 107 mmol/L (ref 98–111)
Creatinine, Ser: 0.97 mg/dL (ref 0.44–1.00)
GFR calc Af Amer: >60 mL/min (ref >60)
GFR calc non Af Amer: 53 mL/min — ABNORMAL LOW (ref >60)
Glucose, Bld: 94 mg/dL (ref 70–99)
Potassium: 3.9 mmol/L (ref 3.5–5.1)
Sodium: 141 mmol/L (ref 135–145)
Total Bilirubin: 0.7 mg/dL (ref 0.3–1.2)
Total Protein: 5.7 g/dL — ABNORMAL LOW (ref 6.5–8.1)

## 2019-06-14 LAB — CBC WITH DIFFERENTIAL/PLATELET
Abs Immature Granulocytes: 0.03 10*3/uL (ref 0.00–0.07)
Basophils Absolute: 0.1 10*3/uL (ref 0.0–0.1)
Basophils Relative: 2 %
Eosinophils Absolute: 0.6 10*3/uL — ABNORMAL HIGH (ref 0.0–0.5)
Eosinophils Relative: 7 %
HCT: 34.3 % — ABNORMAL LOW (ref 36.0–46.0)
Hemoglobin: 10.7 g/dL — ABNORMAL LOW (ref 12.0–15.0)
Immature Granulocytes: 0 %
Lymphocytes Relative: 14 %
Lymphs Abs: 1.1 10*3/uL (ref 0.7–4.0)
MCH: 29.7 pg (ref 26.0–34.0)
MCHC: 31.2 g/dL (ref 30.0–36.0)
MCV: 95.3 fL (ref 80.0–100.0)
Monocytes Absolute: 0.7 10*3/uL (ref 0.1–1.0)
Monocytes Relative: 9 %
Neutro Abs: 5.4 10*3/uL (ref 1.7–7.7)
Neutrophils Relative %: 68 %
Platelets: 256 10*3/uL (ref 150–400)
RBC: 3.6 MIL/uL — ABNORMAL LOW (ref 3.87–5.11)
RDW: 14.7 % (ref 11.5–15.5)
WBC: 7.9 10*3/uL (ref 4.0–10.5)
nRBC: 0 % (ref 0.0–0.2)

## 2019-06-14 LAB — URINALYSIS, ROUTINE W REFLEX MICROSCOPIC
Bilirubin Urine: NEGATIVE
Crystals: NONE SEEN — AB
Glucose, UA: NEGATIVE mg/dL
Ketones, ur: NEGATIVE mg/dL
Nitrite: NEGATIVE
Protein, ur: NEGATIVE mg/dL
Specific Gravity, Urine: 1.005 (ref 1.005–1.030)
WBC, UA: >50 WBC/hpf — ABNORMAL HIGH (ref 0–5)
pH: 7 (ref 5.0–8.0)

## 2019-06-14 MED ORDER — SODIUM CHLORIDE 0.9 % IV BOLUS
500.0000 mL | Freq: Once | INTRAVENOUS | Status: AC
  Administered 2019-06-14: 500 mL via INTRAVENOUS

## 2019-06-14 MED ORDER — SODIUM CHLORIDE 0.9 % IV SOLN
1.0000 g | Freq: Once | INTRAVENOUS | Status: AC
  Administered 2019-06-14: 16:00:00 1 g via INTRAVENOUS
  Filled 2019-06-14: qty 10

## 2019-06-14 MED ORDER — CEPHALEXIN 500 MG PO CAPS: 500.0000 mg | ORAL_CAPSULE | Freq: Two times a day (BID) | ORAL | 0 refills | Status: AC

## 2019-06-14 NOTE — ED Notes (Signed)
PTAR called  

## 2019-06-14 NOTE — ED Provider Notes (Signed)
Blood pressure (!) 153/75, pulse 89, temperature 97.9 F (36.6 C), temperature source Axillary, resp. rate 15, SpO2 100 %.  In short, Savannah Garrison is a 85 y.o. female with a chief complaint of Altered Mental Status .  Refer to the original H&P for additional details.  07:26 PM  Called to patient's bedside and she fell out of bed.  The side rails were up but she scooted to the of the bed and slid down onto the floor.  I do not see outward signs of trauma in her head, lower back.  She has full range of motion of all extremities.  Waiting on PTAR for transport back to the facility.  No imaging required at this time.     Margette Fast, MD 06/14/19 [phone]

## 2019-06-14 NOTE — ED Notes (Signed)
Pt is attempting to get out of bed. Pt redirected and comforted with extra blankets. Will continue to monitor.

## 2019-06-14 NOTE — Discharge Instructions (Addendum)
As discussed, you have been diagnosed with a urinary tract infection, and delirium.  It is important to take all medication as directed, monitor your condition carefully, and do not hesitate to return here for concerning changes in your condition.

## 2019-06-14 NOTE — ED Triage Notes (Signed)
Pt was treated for a UTI diagnosed 2 weeks ago. Pt has been hallucinating x2 wks. Has not improved. Hx of alzheimer's. Antibiotics finished still a strong odor from urine. Culture of urine sent on Tuesday and is not back yet. No fever reported.

## 2019-06-14 NOTE — ED Provider Notes (Signed)
[facility]-EMERGENCY DEPT Provider Note   CSN: [id number] Arrival date & time: 06/14/19  1228     History   Chief Complaint Chief Complaint  Patient presents with  . Altered Mental Status    HPI Savannah Garrison is a 85 y.o. female.     HPI Patient presents with concern of altered mental status. The patient herself has dementia, offers only intermittent verbal responses, infrequently appropriately. She does not follow commands reliably either. She presents due to concerns of hallucination, increasing confusion. It seems as though she completed therapy for a urinary tract infection about 2 weeks ago, and symptoms have been present since then, though this is unclear.  5 caveat secondary to dementia.   No report of fall, trauma, fever, other changes.  Past Medical History:  Diagnosis Date  . Alzheimer's disease (HCC)   . History of bilateral cataract extraction   . Hyperlipidemia   . MCI (mild cognitive impairment)   . Osteoporosis   . Vitamin B12 deficiency   . Vitamin D deficiency     Patient Active Problem List   Diagnosis Date Noted  . History of CVA (cerebrovascular accident) 06/01/2015  . UTI (urinary tract infection) 06/01/2015  . Encephalopathy, hypertensive 06/01/2015  . Dizziness 05/31/2015  . Elevated troponin 05/31/2015  . Hypertensive urgency 05/31/2015  . Moderate dementia with behavioral disturbance (HCC) 11/05/2013  . Memory loss 11/05/2013    Past Surgical History:  Procedure Laterality Date  . CATARACT EXTRACTION    . DILATION AND CURETTAGE OF UTERUS    . TONSILLECTOMY AND ADENOIDECTOMY       OB History   No obstetric history on file.      Home Medications    Prior to Admission medications   Medication Sig Start Date End Date Taking? Authorizing Provider  aspirin EC 81 MG tablet Take 81 mg by mouth daily.    [provider]  donepezil (ARICEPT) 10 MG tablet Take 1 tablet (10 mg total) by mouth at  bedtime. 06/27/17   Penumalli, Glenford Bayley, MD  LORazepam (ATIVAN) 0.5 MG tablet Take 0.5 mg by mouth every morning. 06/03/16   [provider]  naproxen sodium (ANAPROX) 220 MG tablet Take 220 mg by mouth 2 (two) times daily as needed (pain).    [provider]  QUEtiapine (SEROQUEL) 25 MG tablet Take 1 tablet (25 mg total) by mouth at bedtime as needed (agitation). 06/27/17   Penumalli, Glenford Bayley, MD  sertraline (ZOLOFT) 25 MG tablet Take 1 tablet (25 mg total) by mouth daily. 06/27/17   Penumalli, Glenford Bayley, MD    Family History Family History  Problem Relation Age of Onset  . Lung cancer Mother   . Alzheimer's disease Father     Social History Social History   Tobacco Use  . Smoking status: Never Smoker  . Smokeless tobacco: Never Used  Substance Use Topics  . Alcohol use: No    Alcohol/week: 0.0 standard drinks    Comment: 11/21/16 none per dgtr  . Drug use: No     Allergies   Patient has no known allergies.   Review of Systems Review of Systems  Unable to perform ROS: Dementia     Physical Exam Updated Vital Signs BP (!) 143/70   Pulse (!) 102   Temp 97.9 F (36.6 C) (Axillary)   Resp 16   SpO2 (!) 69%   Physical Exam Vitals signs and nursing note reviewed.  Constitutional:  General: She is not in acute distress.    Appearance: She is well-developed.     Comments: Withdrawn, thin elderly female minimally interactive spontaneously.  HENT:     Head: Normocephalic and atraumatic.  Eyes:     Conjunctiva/sclera: Conjunctivae normal.  Cardiovascular:     Rate and Rhythm: Normal rate and regular rhythm.  Pulmonary:     Effort: Pulmonary effort is normal. No respiratory distress.     Breath sounds: Normal breath sounds. No stridor.  Abdominal:     General: There is no distension.  Skin:    General: Skin is warm and dry.  Neurological:     Motor: Atrophy present.     Comments: Does not follow commands reliably, but does move all  extremities spontaneously.  No facial asymmetry.  Speech is sporadic, but clear.  Psychiatric:        Cognition and Memory: Cognition is impaired.      ED Treatments / Results  Labs (all labs ordered are listed, but only abnormal results are displayed) Labs Reviewed  COMPREHENSIVE METABOLIC PANEL - Abnormal; Notable for the following components:      Result Value   Calcium 8.5 (*)    Total Protein 5.7 (*)    Albumin 2.9 (*)    GFR calc non Af Amer 53 (*)    All other components within normal limits  CBC WITH DIFFERENTIAL/PLATELET - Abnormal; Notable for the following components:   RBC 3.60 (*)    Hemoglobin 10.7 (*)    HCT 34.3 (*)    Eosinophils Absolute 0.6 (*)    All other components within normal limits  URINALYSIS, ROUTINE W REFLEX MICROSCOPIC - Abnormal; Notable for the following components:   APPearance CLOUDY (*)    Hgb urine dipstick MODERATE (*)    Leukocytes,Ua LARGE (*)    WBC, UA >50 (*)    Bacteria, UA FEW (*)    Crystals NONE SEEN (*)    All other components within normal limits  URINE CULTURE    EKG None  Radiology Dg Chest Port 1 View  Result Date: 06/14/2019 CLINICAL DATA:  Pt was treated for a UTI diagnosed 2 weeks ago. Pt has been hallucinating x 2 weeks, has not improved. Hx of alzheimer's. Antibiotics finished, still a strong odor from urine. Culture of urine sent on Tuesday and is not back yet. EXAM: PORTABLE CHEST 1 VIEW COMPARISON:  01/28/2019 FINDINGS: Cardiac silhouette is normal in size. No mediastinal or hilar masses. No evidence of adenopathy. Lungs are clear.  No pleural effusion or pneumothorax. Skeletal structures are grossly intact. IMPRESSION: No active disease. Electronically Signed   By: Amie Portland M.D.   On: 06/14/2019 14:29    Procedures Procedures (including critical care time)  Medications Ordered in ED Medications  cefTRIAXone (ROCEPHIN) 1 g in sodium chloride 0.9 % 100 mL IVPB (has no administration in time range)   sodium chloride 0.9 % bolus 500 mL (0 mLs Intravenous Stopped 06/14/19 1433)     Initial Impression / Assessment and Plan / ED Course  I have reviewed the triage vital signs and the nursing notes.  Pertinent labs & imaging results that were available during my care of the patient were reviewed by me and considered in my medical decision making (see chart for details).        3:15 PM Patient remains in similar condition, disoriented, moving all extremities spontaneously, hemodynamically unremarkable. Urinalysis consistent with infection, with leukocyte positive, pyuria.  Patient will receive ceftriaxone,  IV, transition to Keflex as an outpatient. Chart reviewed, no useful urine culture results from the past few months, no multiple studies have been performed, typically without any uniform growth.  This elderly female presents with concern for altered mental status Patient is demented at baseline, complicating her evaluation, but is awake, alert, hemodynamically unremarkable, moving all extremities spontaneously. Some suspicion for delirium secondary to UTI, complicating her dementia. Urinalysis consistent with infection, labs, vitals otherwise reassuring, no evidence for bacteremia, sepsis. Patient appropriate for outpatient therapy given the absence of other acute findings, after initial IV ceftriaxone.  Final Clinical Impressions(s) / ED Diagnoses   Final diagnoses:  Delirium  Lower urinary tract infectious disease    ED Discharge Orders         Ordered    cephALEXin (KEFLEX) 500 MG capsule  2 times daily     06/14/19 1517           Carmin Muskrat, MD 06/14/19 [phone]

## 2019-06-15 DIAGNOSIS — R279 Unspecified lack of coordination: Secondary | ICD-10-CM | POA: Diagnosis not present

## 2019-06-15 DIAGNOSIS — Z743 Need for continuous supervision: Secondary | ICD-10-CM | POA: Diagnosis not present

## 2019-06-15 DIAGNOSIS — R2681 Unsteadiness on feet: Secondary | ICD-10-CM | POA: Diagnosis not present

## 2019-06-15 DIAGNOSIS — R0902 Hypoxemia: Secondary | ICD-10-CM | POA: Diagnosis not present

## 2019-06-15 NOTE — ED Notes (Signed)
Pt redirected multiple times for attempting to get out of the bed.

## 2019-06-15 NOTE — ED Notes (Signed)
Pt found soiled after incontinence.  Pt provided with peri care and fresh linens.

## 2019-06-15 NOTE — ED Notes (Signed)
Pt found out of bed on floor at foot of bed, both bed rails were up. No obvious injuries or complaints of pain, vitals stable, and provider made aware.

## 2019-06-15 NOTE — ED Notes (Signed)
Pt redirected back into bed after attempting to get up.

## 2019-06-16 LAB — URINE CULTURE: Culture: >=100000 — AB

## 2019-06-17 ENCOUNTER — Telehealth: Payer: Self-pay | Admitting: Emergency Medicine

## 2019-06-17 NOTE — Telephone Encounter (Signed)
Post ED Visit - Positive Culture Follow-up  Culture report reviewed by antimicrobial stewardship pharmacist: [facility] Team []  Elenor Quinones, Pharm.D. []  Heide Guile, Pharm.D., BCPS AQ-ID []  Parks Neptune, Pharm.D., BCPS []  Alycia Rossetti, Pharm.D., BCPS []  [place], [place].D., BCPS, AAHIVP []  Legrand Como, Pharm.D., BCPS, AAHIVP []  Salome Arnt, PharmD, BCPS []  Johnnette Gourd, PharmD, BCPS []  Hughes Better, PharmD, BCPS []  Leeroy Cha, PharmD []  Laqueta Linden, PharmD, BCPS []  Albertina Parr, PharmD  [place] Team []  Leodis Sias, PharmD []  Lindell Spar, PharmD []  Royetta Asal, PharmD []  Graylin Shiver, Rph []  Rema Fendt) Glennon Mac, PharmD []  Arlyn Dunning, PharmD []  Netta Cedars, PharmD []  Dia Sitter, PharmD []  Leone Haven, PharmD []  Gretta Arab, PharmD []  Theodis Shove, PharmD []  Peggyann Juba, PharmD []  Reuel Boom, PharmD   Positive urine culture Treated with cephalexin, organism sensitive to the same and no further patient follow-up is required at this time.  Hazle Nordmann 06/17/2019, 12:38 PM

## 2019-07-03 DIAGNOSIS — F0390 Unspecified dementia without behavioral disturbance: Secondary | ICD-10-CM | POA: Diagnosis not present

## 2019-07-03 DIAGNOSIS — E7800 Pure hypercholesterolemia, unspecified: Secondary | ICD-10-CM | POA: Diagnosis not present

## 2019-07-03 DIAGNOSIS — M810 Age-related osteoporosis without current pathological fracture: Secondary | ICD-10-CM | POA: Diagnosis not present

## 2019-07-03 DIAGNOSIS — I10 Essential (primary) hypertension: Secondary | ICD-10-CM | POA: Diagnosis not present

## 2019-07-26 DEATH — deceased

## 2019-09-15 IMAGING — CR LEFT TIBIA AND FIBULA - 2 VIEW
4 series · 4 of 4 positions shown · non-contrast
Comparison: 12/12/2016

CLINICAL DATA: Lower leg swelling over the last 3 weeks.

EXAM:
LEFT TIBIA AND FIBULA - 2 VIEW

[t tib/fib ap left (1 of 2)]
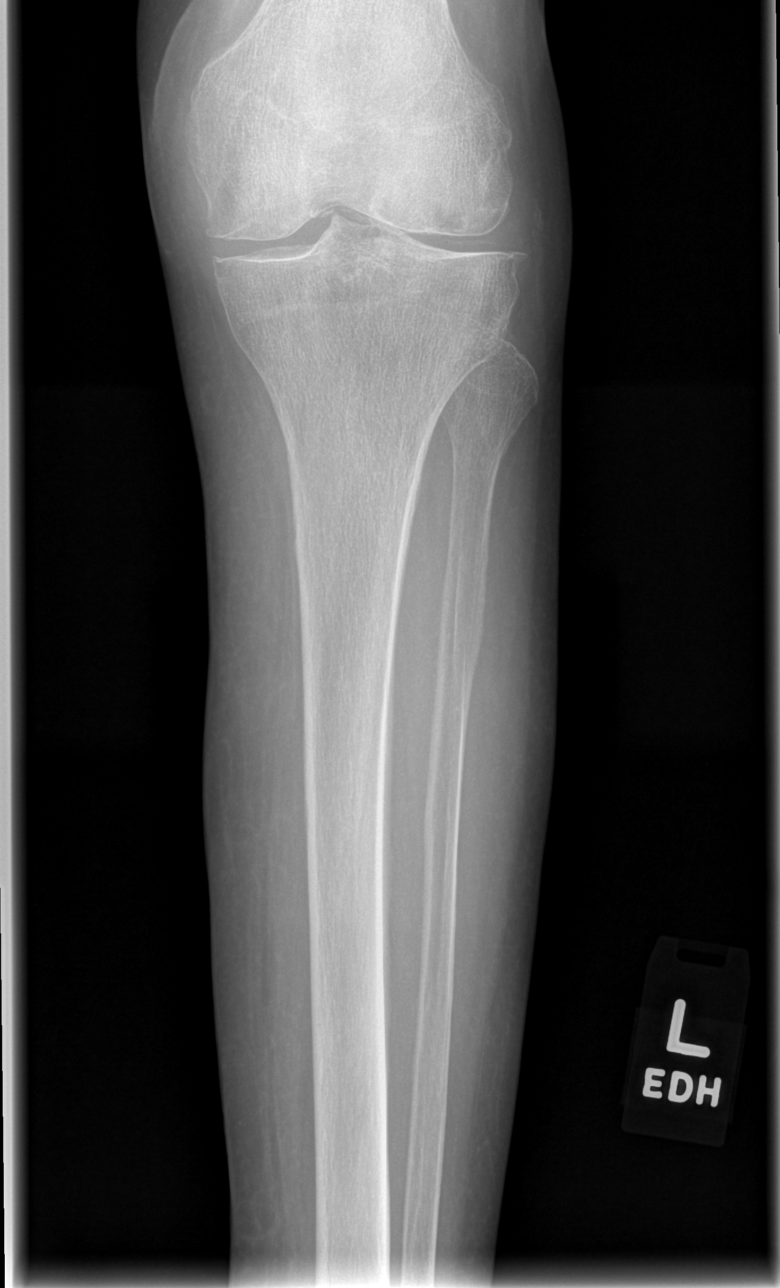

[t tib/fib ap left (2 of 2)]
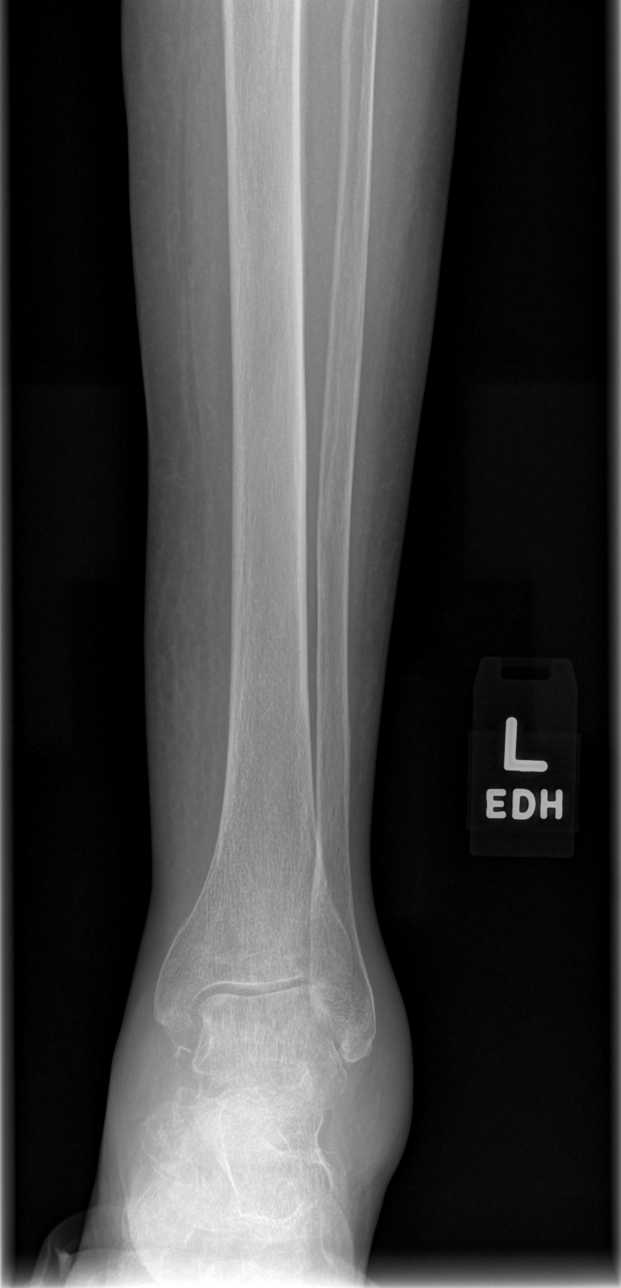

[t tib/fib lat left (1 of 2)]
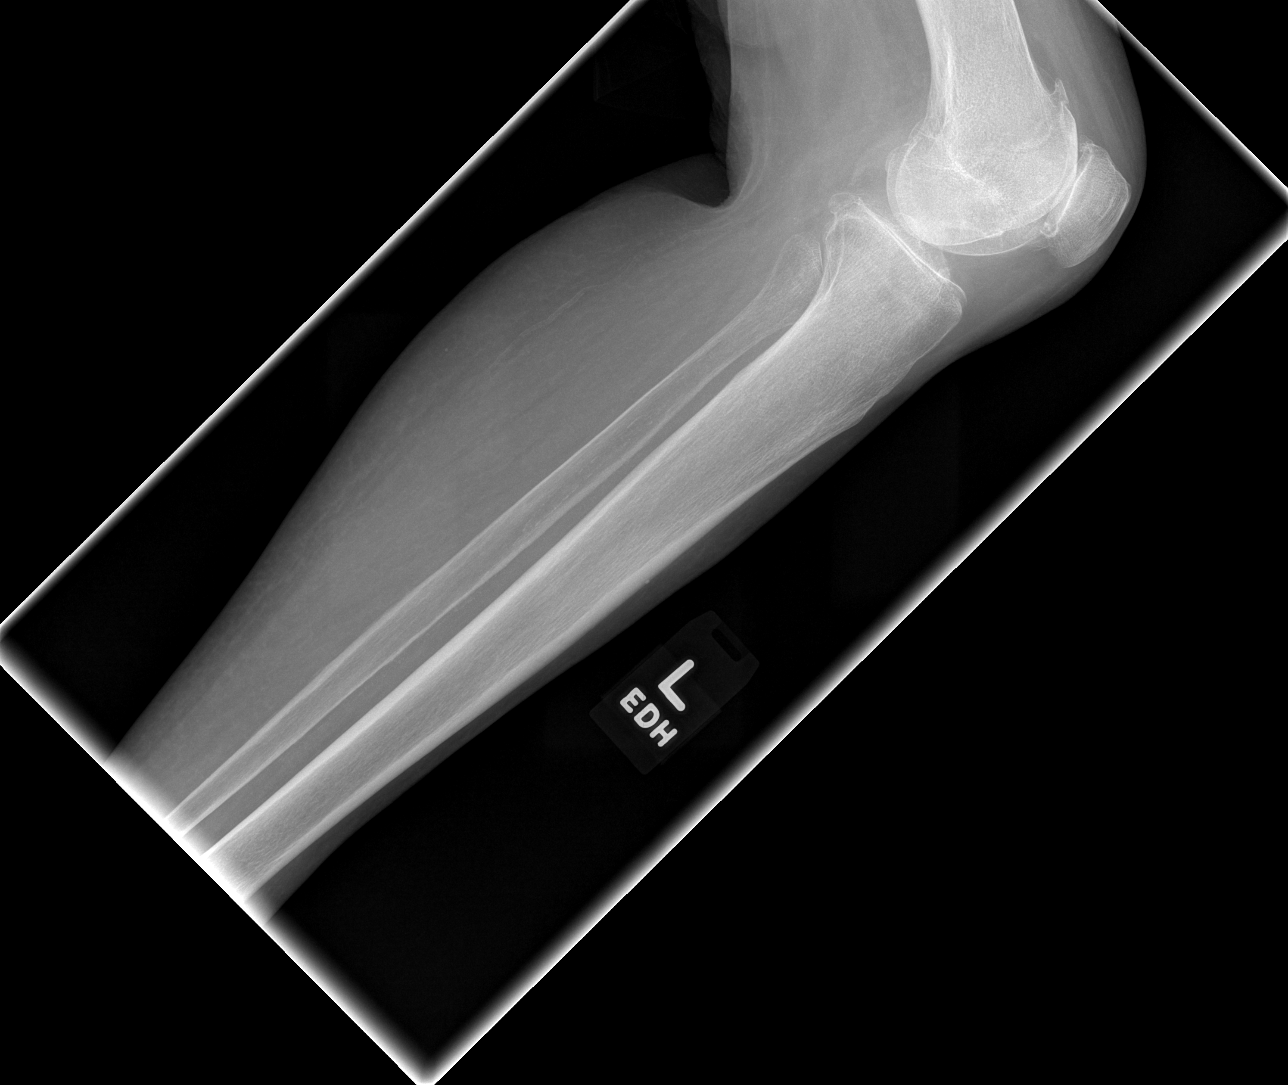

[t tib/fib lat left (2 of 2)]
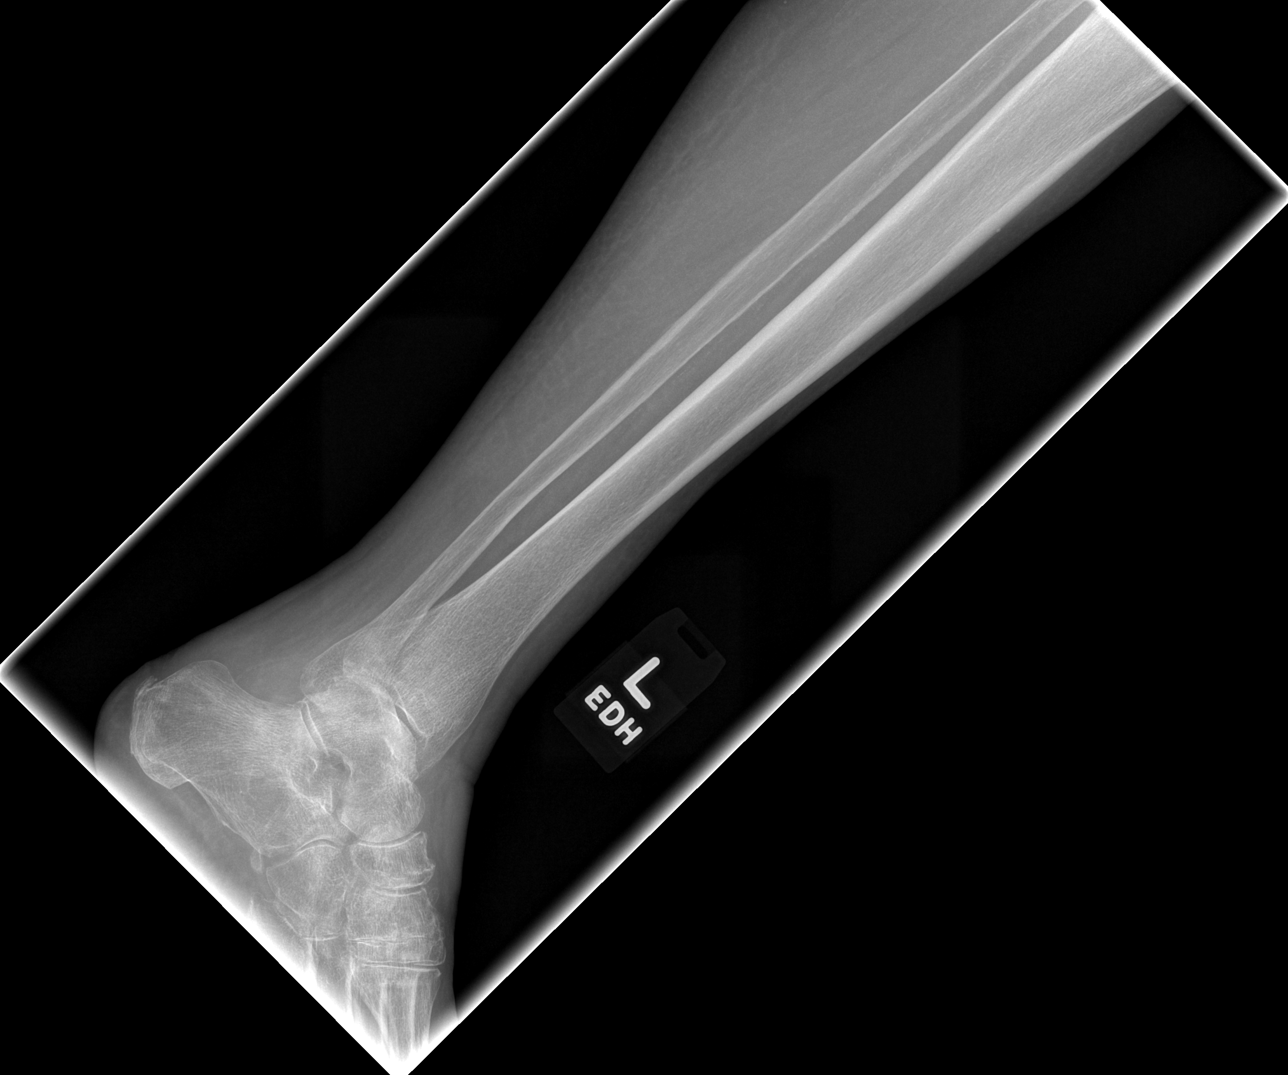

[4 of 4 positions shown; findings below may reference images not displayed]

FINDINGS: Nonspecific soft tissue swelling of the lower leg. The underlying
bones appear normal without fracture or focal lesion. No vascular
calcification is seen.
IMPRESSION: Nonspecific distal soft tissue swelling.
